# Patient Record
Sex: Male | Born: 1985 | Race: Black or African American | Hispanic: No | Marital: Married | Smoking: Never smoker
Health system: Southern US, Community
[De-identification: ages and names within clinical notes are randomized; demographics above are authoritative.]

---

## 2010-11-10 ENCOUNTER — Emergency Department: Payer: Self-pay | Admitting: Emergency Medicine

## 2014-01-07 ENCOUNTER — Emergency Department: Payer: Self-pay | Admitting: Emergency Medicine

## 2014-01-07 LAB — CBC
HCT: 47 % (ref 40.0–52.0)
HGB: 15.1 g/dL (ref 13.0–18.0)
MCH: 27.9 pg (ref 26.0–34.0)
MCHC: 32 g/dL (ref 32.0–36.0)
MCV: 87 fL (ref 80–100)
Platelet: 358 10*3/uL (ref 150–440)
RBC: 5.39 10*6/uL (ref 4.40–5.90)
RDW: 13.8 % (ref 11.5–14.5)
WBC: 20.5 10*3/uL — AB (ref 3.8–10.6)

## 2014-01-07 LAB — BASIC METABOLIC PANEL
ANION GAP: 9 (ref 7–16)
BUN: 14 mg/dL (ref 7–18)
CHLORIDE: 102 mmol/L (ref 98–107)
CO2: 26 mmol/L (ref 21–32)
Calcium, Total: 9.1 mg/dL (ref 8.5–10.1)
Creatinine: 1.27 mg/dL (ref 0.60–1.30)
EGFR (African American): >60
EGFR (Non-African Amer.): >60
Glucose: 144 mg/dL — ABNORMAL HIGH (ref 65–99)
Osmolality: 277 (ref 275–301)
POTASSIUM: 3.5 mmol/L (ref 3.5–5.1)
Sodium: 137 mmol/L (ref 136–145)

## 2014-01-07 LAB — INFLUENZA A,B,H1N1 - PCR (ARMC)
H1N1FLUPCR: NOT DETECTED
Influenza A By PCR: NEGATIVE
Influenza B By PCR: NEGATIVE

## 2016-08-31 ENCOUNTER — Emergency Department
Admission: EM | Admit: 2016-08-31 | Discharge: 2016-08-31 | Disposition: A | Discharge status: Home / Self Care | Payer: 59 | Attending: Emergency Medicine | Admitting: Emergency Medicine

## 2016-08-31 ENCOUNTER — Emergency Department: Payer: 59

## 2016-08-31 ENCOUNTER — Encounter: Payer: Self-pay | Admitting: Emergency Medicine

## 2016-08-31 DIAGNOSIS — R109 Unspecified abdominal pain: Secondary | ICD-10-CM | POA: Diagnosis not present

## 2016-08-31 DIAGNOSIS — R1011 Right upper quadrant pain: Secondary | ICD-10-CM | POA: Insufficient documentation

## 2016-08-31 LAB — URINALYSIS, COMPLETE (UACMP) WITH MICROSCOPIC
BACTERIA UA: NONE SEEN
BILIRUBIN URINE: NEGATIVE
GLUCOSE, UA: NEGATIVE mg/dL
HGB URINE DIPSTICK: NEGATIVE
Ketones, ur: NEGATIVE mg/dL
LEUKOCYTES UA: NEGATIVE
NITRITE: NEGATIVE
Protein, ur: NEGATIVE mg/dL
SPECIFIC GRAVITY, URINE: 1.027 (ref 1.005–1.030)
Squamous Epithelial / LPF: NONE SEEN
pH: 5 (ref 5.0–8.0)

## 2016-08-31 LAB — CBC
HEMATOCRIT: 42 % (ref 40.0–52.0)
HEMOGLOBIN: 13.9 g/dL (ref 13.0–18.0)
MCH: 28.6 pg (ref 26.0–34.0)
MCHC: 33.2 g/dL (ref 32.0–36.0)
MCV: 86.3 fL (ref 80.0–100.0)
Platelets: 365 10*3/uL (ref 150–440)
RBC: 4.86 MIL/uL (ref 4.40–5.90)
RDW: 13.9 % (ref 11.5–14.5)
WBC: 12.3 10*3/uL — AB (ref 3.8–10.6)

## 2016-08-31 LAB — COMPREHENSIVE METABOLIC PANEL
ALT: 53 U/L (ref 17–63)
ANION GAP: 7 (ref 5–15)
AST: 38 U/L (ref 15–41)
Albumin: 4.8 g/dL (ref 3.5–5.0)
Alkaline Phosphatase: 61 U/L (ref 38–126)
BILIRUBIN TOTAL: 0.5 mg/dL (ref 0.3–1.2)
BUN: 13 mg/dL (ref 6–20)
CO2: 30 mmol/L (ref 22–32)
Calcium: 9.7 mg/dL (ref 8.9–10.3)
Chloride: 103 mmol/L (ref 101–111)
Creatinine, Ser: 0.73 mg/dL (ref 0.61–1.24)
GFR calc Af Amer: >60 mL/min (ref >60)
Glucose, Bld: 95 mg/dL (ref 65–99)
POTASSIUM: 4 mmol/L (ref 3.5–5.1)
Sodium: 140 mmol/L (ref 135–145)
TOTAL PROTEIN: 8.1 g/dL (ref 6.5–8.1)

## 2016-08-31 LAB — LIPASE, BLOOD: Lipase: 43 U/L (ref 11–51)

## 2016-08-31 MED ORDER — IOPAMIDOL (ISOVUE-300) INJECTION 61%
100.0000 mL | Freq: Once | INTRAVENOUS | Status: AC | PRN
  Administered 2016-08-31: 100 mL via INTRAVENOUS
  Filled 2016-08-31: qty 100

## 2016-08-31 MED ORDER — IOPAMIDOL (ISOVUE-300) INJECTION 61%
30.0000 mL | Freq: Once | INTRAVENOUS | Status: AC | PRN
  Administered 2016-08-31: 30 mL via ORAL
  Filled 2016-08-31: qty 30

## 2016-08-31 NOTE — ED Notes (Signed)
E-signature pad not working in the room, unable to obtain E-Signature; discharge instructions reviewed, follow up care reviewed; pt verbalized understanding.

## 2016-08-31 NOTE — ED Triage Notes (Signed)
Pt c/o right side abdominal pain since yesterday. Worse after eating.  No vomiting but has had some loose stools.  No fevers.  Respirations unlabored. ambulatory to triage.  VSS.

## 2016-08-31 NOTE — ED Provider Notes (Signed)
[facility] Emergency Department Provider Note  Time seen: 3:39 PM  I have reviewed the triage vital signs and the nursing notes.   HISTORY  Chief Complaint Abdominal Pain    HPI Ernest Dixon is a 31 y.o. male with no past medical history who presents to the emergency department for right-sided abdominal pain. According to the patient since yesterday he has been having some mild right-sided abdominal pain. Patient states at first he thought he might of pulled a muscle. States he has been tolerating the pain well until he ate lunch today. He states approximately one hour after eating he developed severe right-sided/right upper quadrant pain. Denies any nausea vomiting or diarrhea. Patient states low-grade fever 99.4 at work. Denies any chest pain or trouble breathing. Denies any dysuria or hematuria. No history of kidney stones.  History reviewed. No pertinent past medical history.  There are no active problems to display for this patient.   History reviewed. No pertinent surgical history.  Prior to Admission medications   Not on File    Allergies no known allergies  History reviewed. No pertinent family history.  Social History Social History  Substance Use Topics  . Smoking status: Never Smoker  . Smokeless tobacco: Never Used  . Alcohol use No    Review of Systems Constitutional: Low-grade fever Cardiovascular: Negative for chest pain. Respiratory: Negative for shortness of breath. Gastrointestinal: Right-sided abdominal pain. Negative for nausea vomiting or diarrhea Genitourinary: Negative for dysuria. Negative for hematuria Musculoskeletal: Negative for back pain. Neurological: Negative for headache All other ROS negative  ____________________________________________   PHYSICAL EXAM:  VITAL SIGNS: ED Triage Vitals  Enc Vitals Group     BP 08/31/16 1405 139/83     Pulse Rate 08/31/16 1405 67     Resp 08/31/16 1405 16   Temp 08/31/16 1402 99 F (37.2 C)     Temp Source 08/31/16 1402 Oral     SpO2 08/31/16 1405 100 %     Weight --      Height --      Head Circumference --      Peak Flow --      Pain Score 08/31/16 1402 9     Pain Loc --      Pain Edu? --      Excl. in [place]? --     Constitutional: Alert and oriented. Well appearing and in no distress. Eyes: Normal exam ENT   Head: Normocephalic and atraumatic.   Mouth/Throat: Mucous membranes are moist. Cardiovascular: Normal rate, regular rhythm. No murmur Respiratory: Normal respiratory effort without tachypnea nor retractions. Breath sounds are clear  Gastrointestinal: Soft, moderate right upper quadrant tenderness palpation right mid abdominal tenderness palpation. No right lower quadrant tenderness palpation. No tenderness over McBurney's point. Abdomen otherwise benign. No rebound or guarding. Musculoskeletal: Nontender with normal range of motion in all extremities.  Neurologic:  Normal speech and language. No gross focal neurologic deficits Skin:  Skin is warm, dry and intact.  Psychiatric: Mood and affect are normal  ____________________________________________   RADIOLOGY  Ultrasound negative CT negative  ____________________________________________   INITIAL IMPRESSION / ASSESSMENT AND PLAN / ED COURSE  Pertinent labs & imaging results that were available during my care of the patient were reviewed by me and considered in my medical decision making (see chart for details).  Patient presents to the emergency department for right-sided abdominal pain much worse after eating today. Patient's labs are largely within normal limits with a very  slight leukocytosis. LFTs are normal, lipase is normal, urinalysis is normal. Given the patient's right-sided abdominal pain much worse after eating we'll obtain a right upper quadrant ultrasound to further evaluate. Patient has no right lower quadrant tenderness but he does have right mid  abdominal tenderness as well as right upper quadrant tenderness. If the ultrasound is completely negative we will proceed with a CT scan to rule out appendicitis. If the ultrasound is positive for stones I believe this is likely the cause of the patient's discomfort.  Ultrasound is negative we'll proceed with a CT scan.  CT scan has resulted negative as well. It is unclear the cause of the patient's discomfort at this time could be musculoskeletal versus intestinal. We will discharge with supportive care and follow-up with his doctor. Patient agreeable to plan.  ____________________________________________   FINAL CLINICAL IMPRESSION(S) / ED DIAGNOSES  Right upper quadrant abdominal pain    Minna AntisPaduchowski, Zitlaly Malson, MD 08/31/16 [phone]

## 2016-08-31 NOTE — ED Notes (Signed)

## 2016-09-02 DIAGNOSIS — R748 Abnormal levels of other serum enzymes: Secondary | ICD-10-CM | POA: Diagnosis not present

## 2016-09-02 DIAGNOSIS — R1013 Epigastric pain: Secondary | ICD-10-CM | POA: Diagnosis not present

## 2016-09-02 DIAGNOSIS — R1011 Right upper quadrant pain: Secondary | ICD-10-CM | POA: Diagnosis not present

## 2017-06-14 ENCOUNTER — Encounter: Payer: Self-pay | Admitting: Physician Assistant

## 2017-06-14 ENCOUNTER — Ambulatory Visit (INDEPENDENT_AMBULATORY_CARE_PROVIDER_SITE_OTHER): Payer: 59 | Admitting: Physician Assistant

## 2017-06-14 VITALS — BP 126/78 | HR 60 | Temp 98.8°F | Resp 16 | Ht 69.0 in | Wt 203.0 lb

## 2017-06-14 DIAGNOSIS — Z114 Encounter for screening for human immunodeficiency virus [HIV]: Secondary | ICD-10-CM | POA: Diagnosis not present

## 2017-06-14 DIAGNOSIS — Z13220 Encounter for screening for lipoid disorders: Secondary | ICD-10-CM

## 2017-06-14 DIAGNOSIS — D649 Anemia, unspecified: Secondary | ICD-10-CM

## 2017-06-14 DIAGNOSIS — L989 Disorder of the skin and subcutaneous tissue, unspecified: Secondary | ICD-10-CM

## 2017-06-14 DIAGNOSIS — Z1329 Encounter for screening for other suspected endocrine disorder: Secondary | ICD-10-CM | POA: Diagnosis not present

## 2017-06-14 DIAGNOSIS — Z131 Encounter for screening for diabetes mellitus: Secondary | ICD-10-CM

## 2017-06-14 DIAGNOSIS — Z0000 Encounter for general adult medical examination without abnormal findings: Secondary | ICD-10-CM | POA: Diagnosis not present

## 2017-06-14 NOTE — Patient Instructions (Signed)

## 2017-06-14 NOTE — Progress Notes (Signed)
       Patient: Ernest Dixon Male    DOB: [birth year]   32 y.o.   MRN: [id number] Visit Date: 06/15/2017  Today's Provider: Trey Sailors, PA-C   Chief Complaint  Patient presents with  . Establish Care   Subjective:    HPI  [name] is a 32 y/o man presenting today to establish care. He lives in [place], [place]. Living with wife of 2 years and daughter age 69 mo. Has not had PCP since pediatrician. Works in Environmental manager as Mining engineer. Also involved in [employer]. Smokes marijuana occasionally. Has recently started working out due to long term weight gain.   Reports he has a skin lesion on his left palm that has been there six years. Also reports plantar wart on left foot.       No Known Allergies  No current outpatient medications on file.  Review of Systems  Constitutional: Negative.   HENT: Negative.   Eyes: Negative.   Respiratory: Negative.   Cardiovascular: Negative.   Gastrointestinal: Negative.   Endocrine: Negative.   Genitourinary: Negative.   Musculoskeletal: Negative.   Skin: Negative.   Allergic/Immunologic: Negative.   Neurological: Negative.   Hematological: Negative.   Psychiatric/Behavioral: Negative.     Social History   Tobacco Use  . Smoking status: Never Smoker  . Smokeless tobacco: Never Used  Substance Use Topics  . Alcohol use: No    Comment: Rarely   Objective:   BP 126/78 (BP Location: Left Arm, Patient Position: Sitting, Cuff Size: Large)   Pulse 60   Temp 98.8 F (37.1 C) (Oral)   Resp 16   Ht  (1.753 m)   Wt 203 lb (92.1 kg)   BMI 29.98 kg/m  Vitals:   06/14/17 1511  BP: 126/78  Pulse: 60  Resp: 16  Temp: 98.8 F (37.1 C)  TempSrc: Oral  Weight: 203 lb (92.1 kg)  Height:  (1.753 m)     Physical Exam  Constitutional: He appears well-developed and well-nourished.  Cardiovascular: Normal rate and regular rhythm.  Pulmonary/Chest: Effort normal and breath  sounds normal.  Abdominal: Soft. Bowel sounds are normal.  Skin: Skin is warm and dry.  Psychiatric: He has a normal mood and affect. His behavior is normal.        Assessment & Plan:     1. Annual physical exam   2. Skin lesion  - Ambulatory referral to Dermatology  3. Screening cholesterol level  - Lipid Profile  4. Diabetes mellitus screening  - Comprehensive Metabolic Panel (CMET)  5. Thyroid disorder screening  - TSH  6. Encounter for screening for HIV  - HIV antibody (with reflex)  7. Anemia, unspecified type  - CBC with Differential  Return in about 1 year (around 06/15/2018) for CPE.  The entirety of the information documented in the History of Present Illness, Review of Systems and Physical Exam were personally obtained by me. Portions of this information were initially documented by Kavin Leech, CMA and reviewed by me for thoroughness and accuracy.         Trey Sailors, PA-C  [facility]

## 2018-04-27 IMAGING — CT CT ABD-PELV W/ CM
2 of 4 series · 16 of 46 positions shown, 18 images · IV contrast (APPLIED)
Comparison: Abdominal ultrasound 08/31/2016

CLINICAL DATA: 30-year-old male with a 1 day history of right-sided
abdominal pain

EXAM:
CT ABDOMEN AND PELVIS WITH CONTRAST
TECHNIQUE: Multidetector CT imaging of the abdomen and pelvis was performed
using the standard protocol following bolus administration of
intravenous contrast.
CONTRAST:  100mL [id number] IOPAMIDOL ([id number]) INJECTION 61%

[Series 2: routine abd/pel with · axial · 0.78mm/px · z∈[-1047,-627]mm · 13 of 96 slices shown, 15 images]
[im 8/96  soft-tissue]
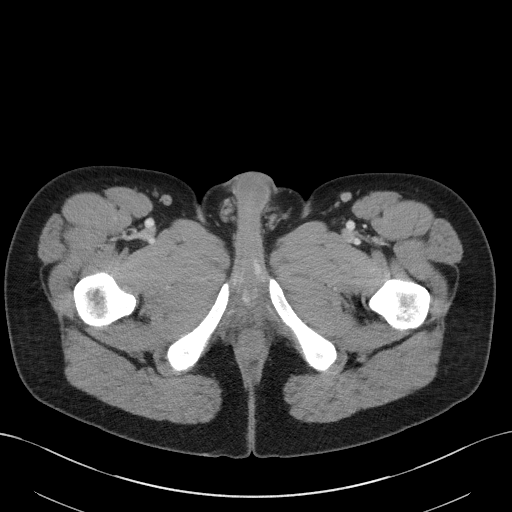
[im 8/96  bone]
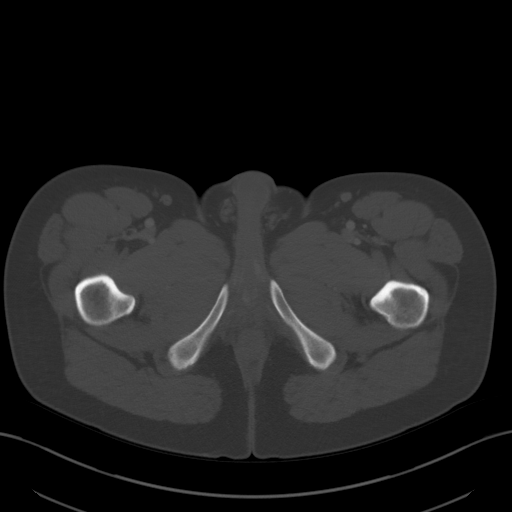
[im 15/96  soft-tissue]
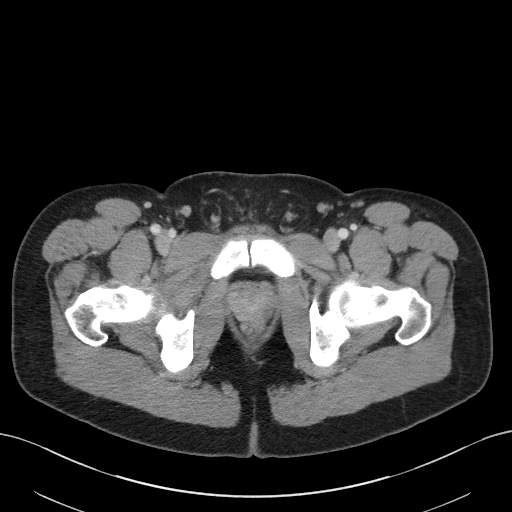
[im 22/96  soft-tissue]
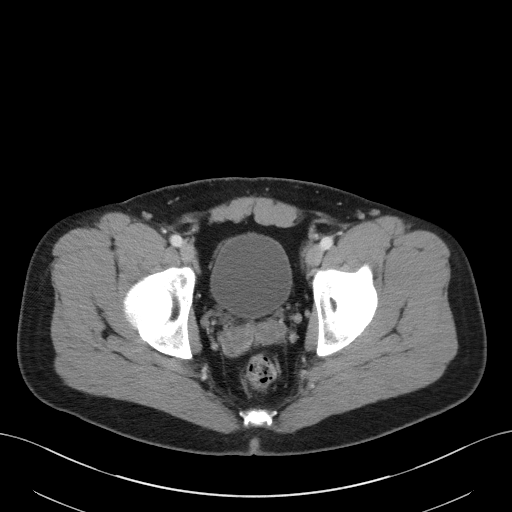
[im 29/96  soft-tissue]
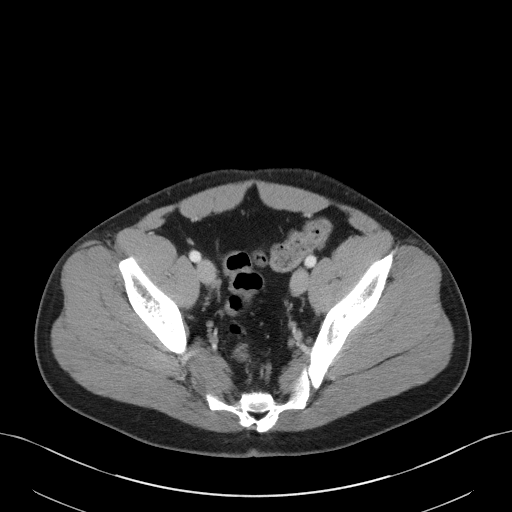
[im 36/96  soft-tissue]
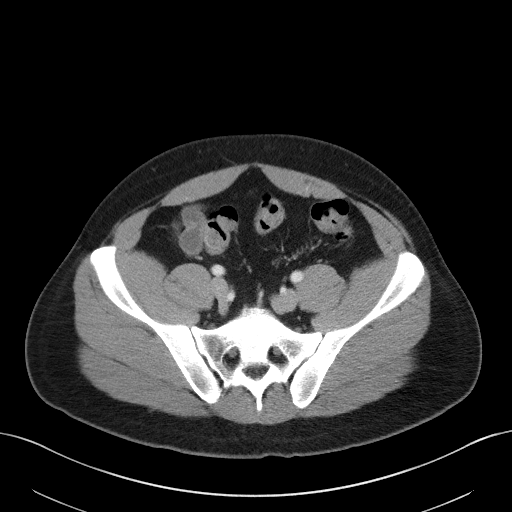
[im 43/96  soft-tissue]
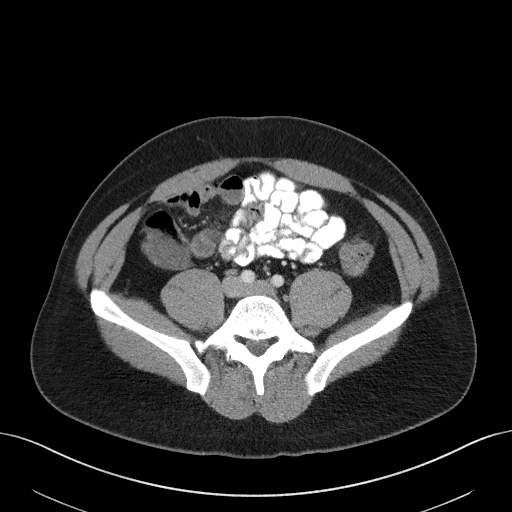
[im 50/96  soft-tissue]
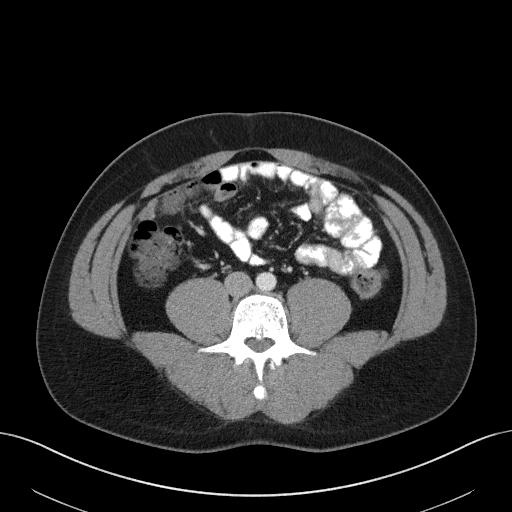
[im 57/96  soft-tissue]
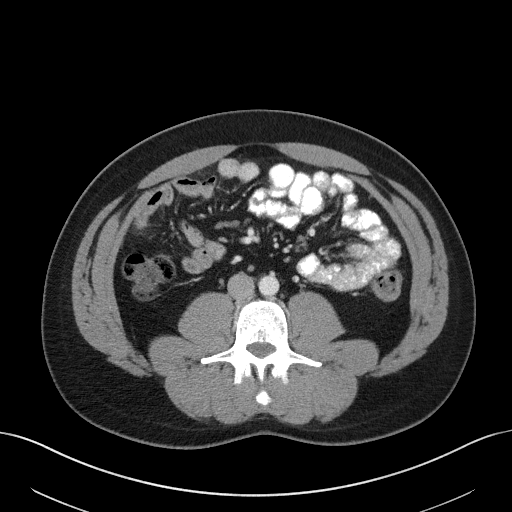
[im 64/96  soft-tissue]
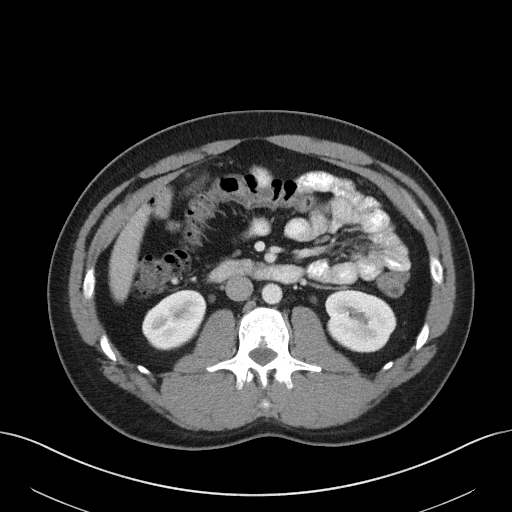
[im 64/96  bone]
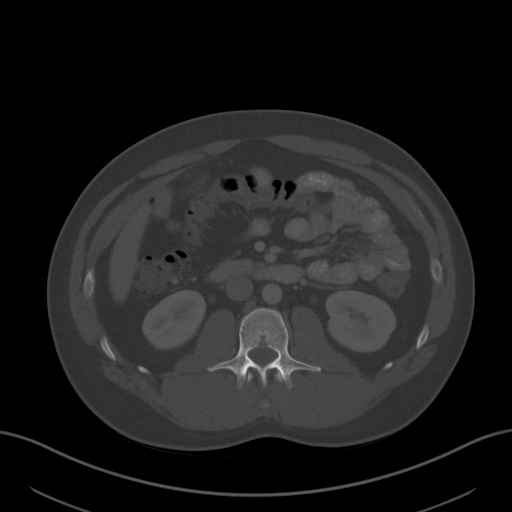
[im 71/96  soft-tissue]
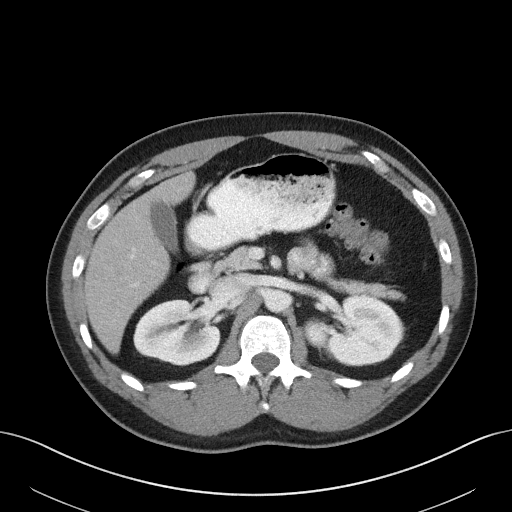
[im 78/96  soft-tissue]
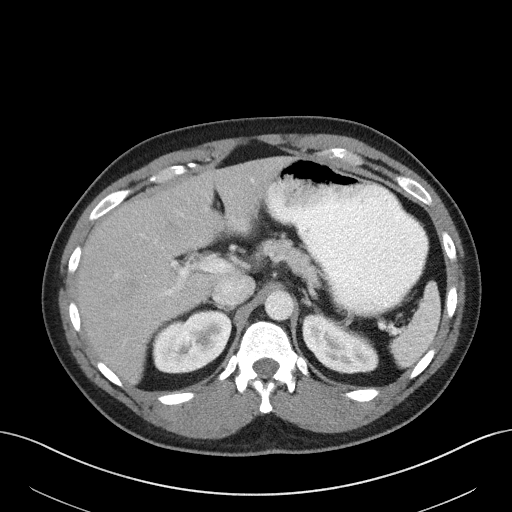
[im 85/96  soft-tissue]
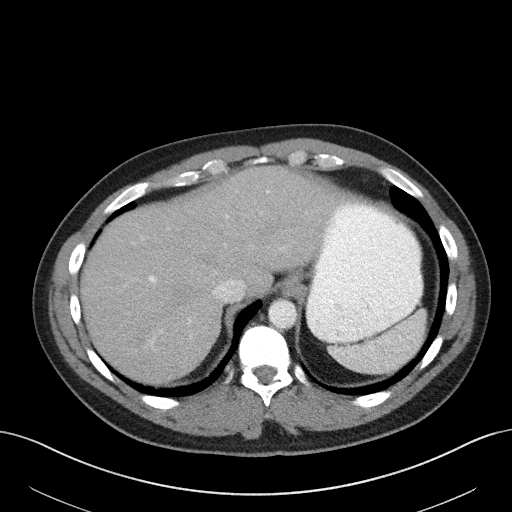
[im 92/96  soft-tissue]
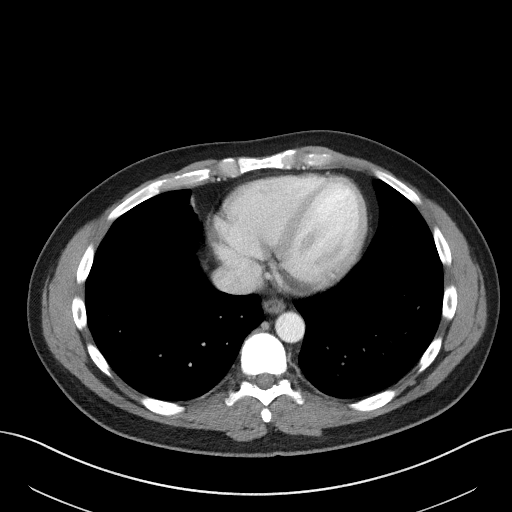

[Series 5: coronal st · coronal · 0.74mm/px · 3 of 85 slices shown]
[im 29/85  soft-tissue]
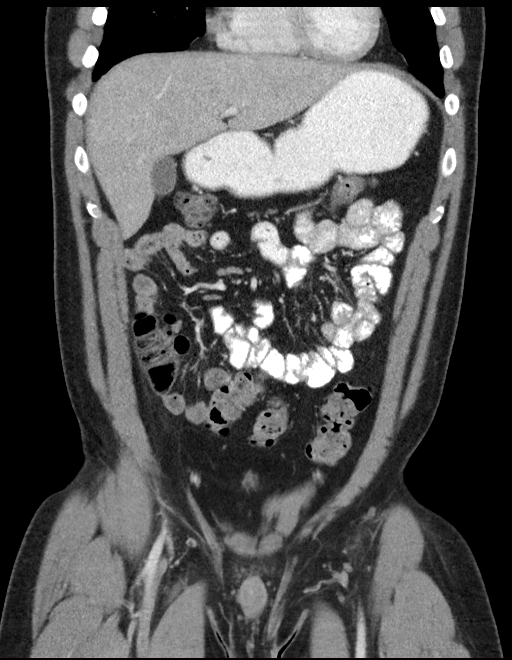
[im 38/85  soft-tissue]
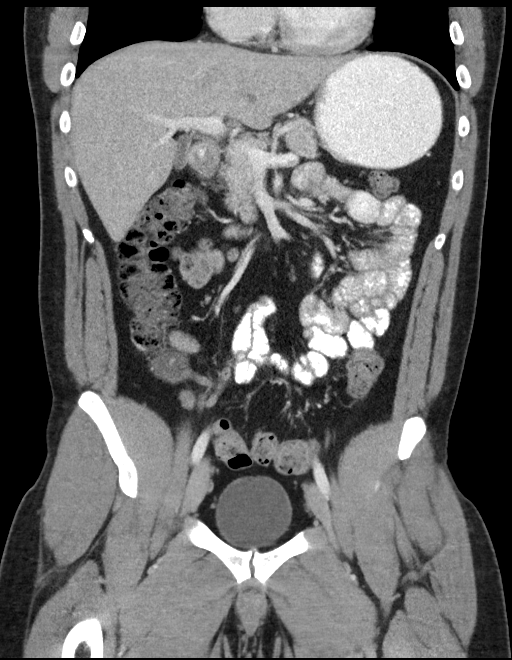
[im 47/85  soft-tissue]
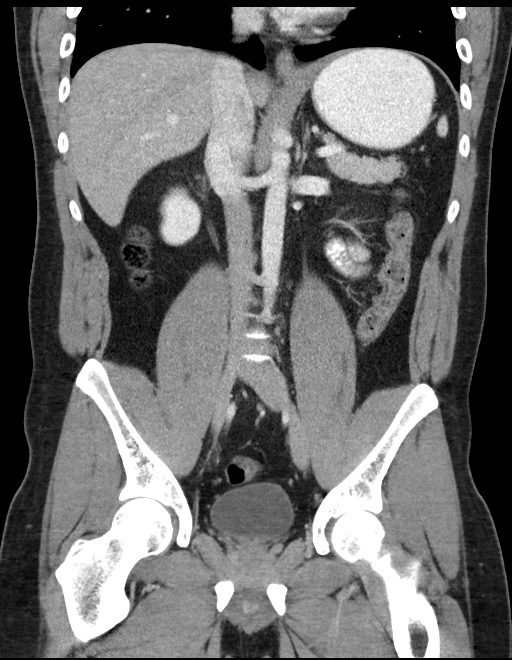

[16 of 46 positions shown; findings below may reference images not displayed]

FINDINGS: Lower chest: The lung bases are clear. Visualized cardiac structures
are within normal limits for size. No pericardial effusion.
Unremarkable visualized distal thoracic esophagus.

Hepatobiliary: Gallbladder is unremarkable. No intra or extrahepatic
biliary ductal dilatation.

Pancreas: Unremarkable. No pancreatic ductal dilatation or
surrounding inflammatory changes.

Spleen: Normal in size without focal abnormality.

Adrenals/Urinary Tract: Adrenal glands are unremarkable. Kidneys are
normal, without renal calculi, focal lesion, or hydronephrosis.
Bladder is unremarkable.

Stomach/Bowel: No evidence of obstruction or focal bowel wall
thickening. Normal appendix in the right lower quadrant. The
terminal ileum is unremarkable.

Vascular/Lymphatic: No significant vascular findings are present. No
enlarged abdominal or pelvic lymph nodes.

Reproductive: Prostate is unremarkable.

Other: Small fat containing umbilical hernia. No abdominopelvic
ascites.

Musculoskeletal: No acute or significant osseous findings.
IMPRESSION: Normal CT scan of the abdomen and pelvis. No acute abnormality to
explain the patient's clinical symptoms.

## 2018-07-26 IMAGING — US US ABDOMEN LIMITED
1 series · 14 of 25 positions shown · non-contrast
Comparison: None.

CLINICAL DATA: Right upper quadrant pain

EXAM:
ULTRASOUND ABDOMEN LIMITED RIGHT UPPER QUADRANT

[Series 1: us abdomen limited · 0.20mm/px · 14 of 40 slices shown]
[im 1/40]
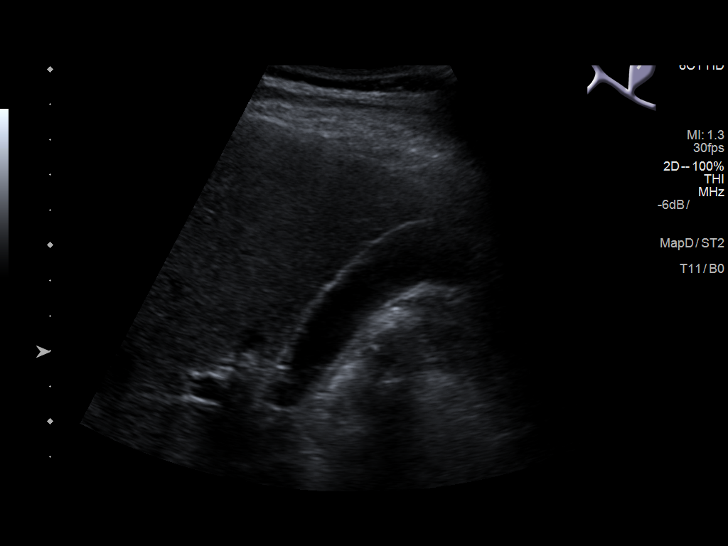
[im 4/40]
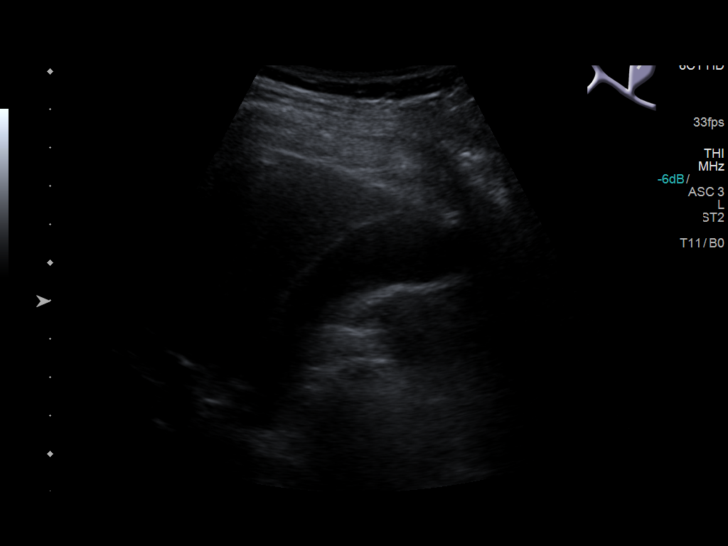
[im 7/40]
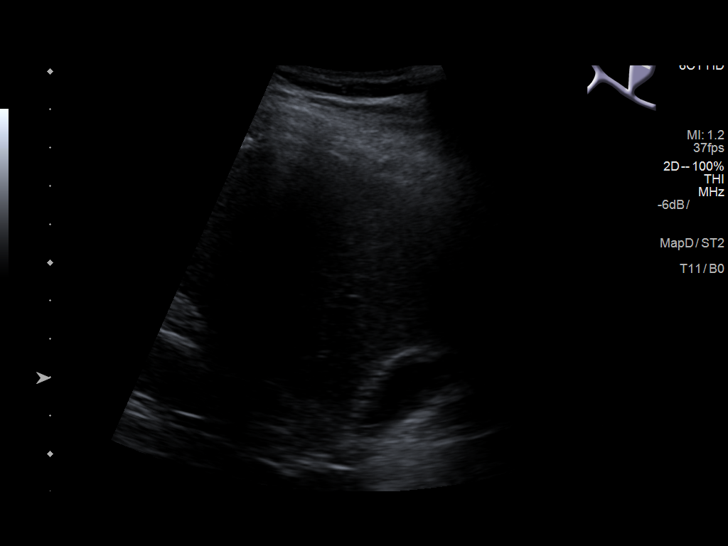
[im 10/40]
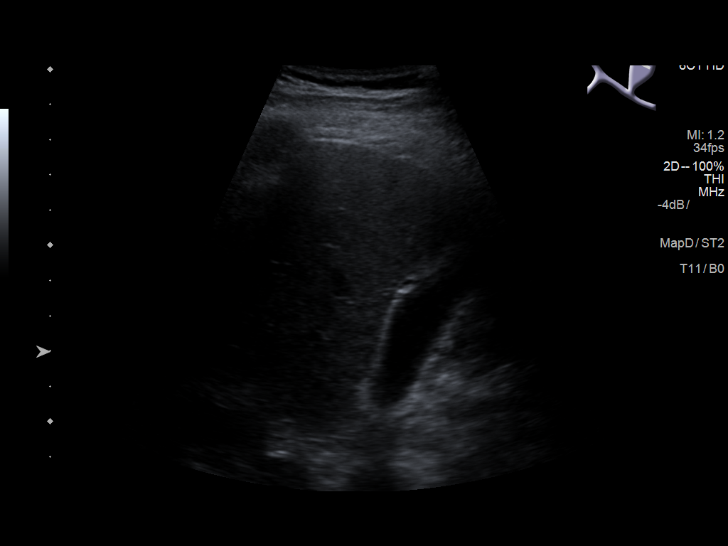
[im 14/40]
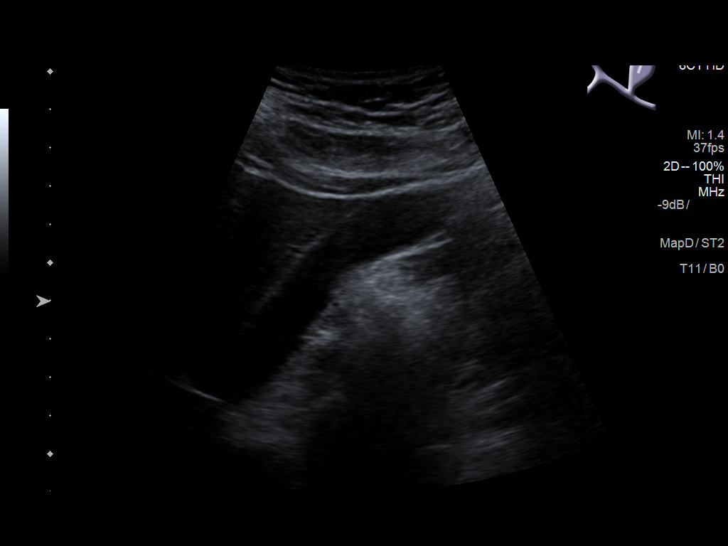
[im 15/40]
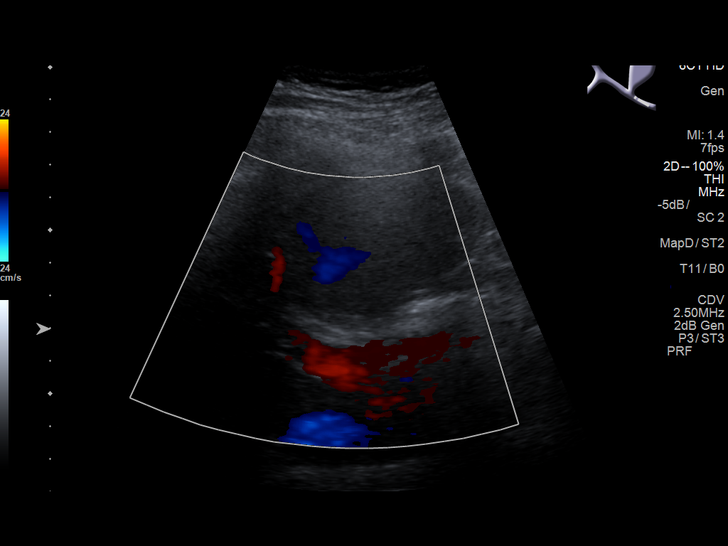
[im 18/40]
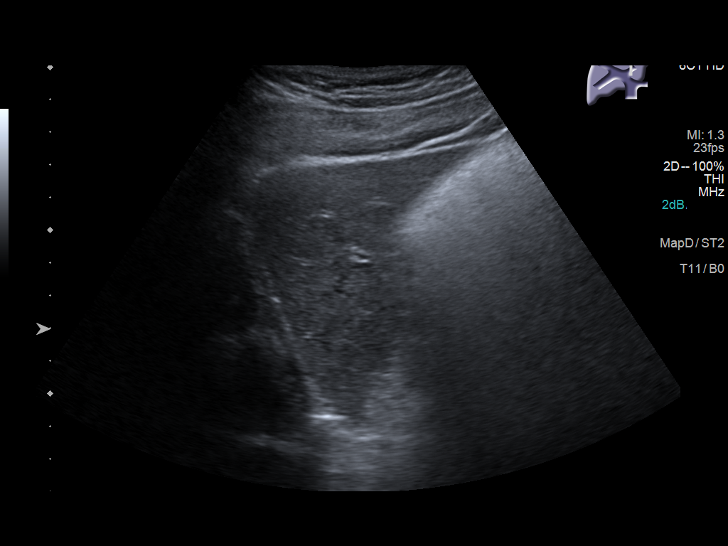
[im 22/40]
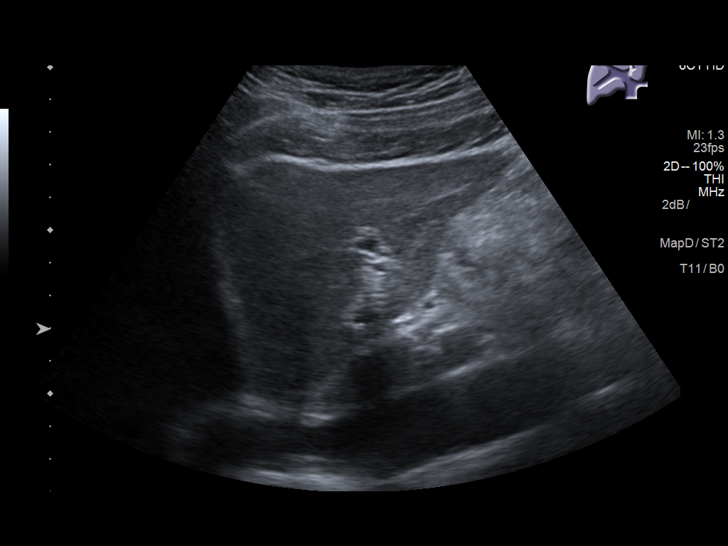
[im 25/40]
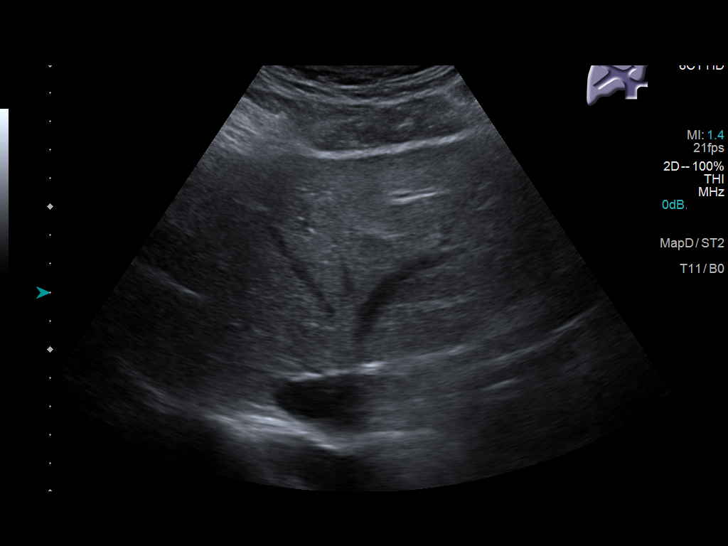
[im 27/40]
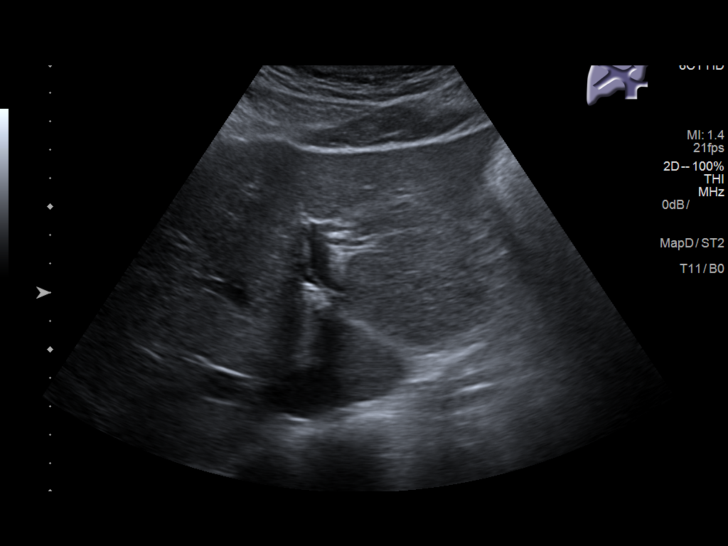
[im 30/40]
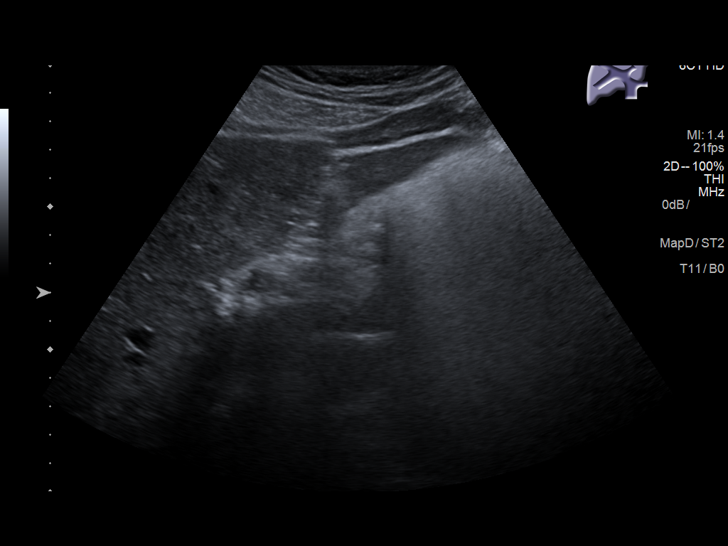
[im 33/40]
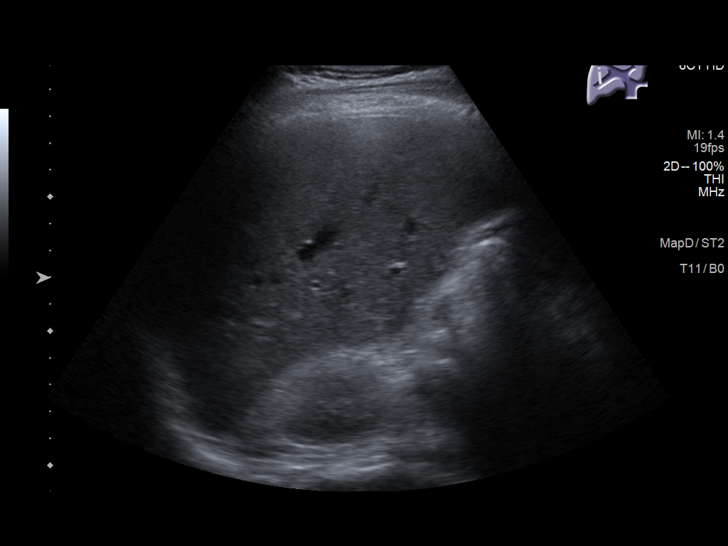
[im 36/40]
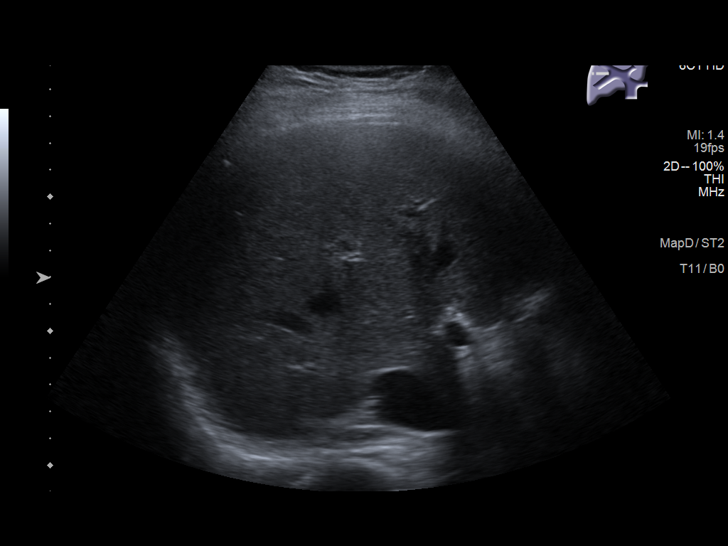
[im 40/40]
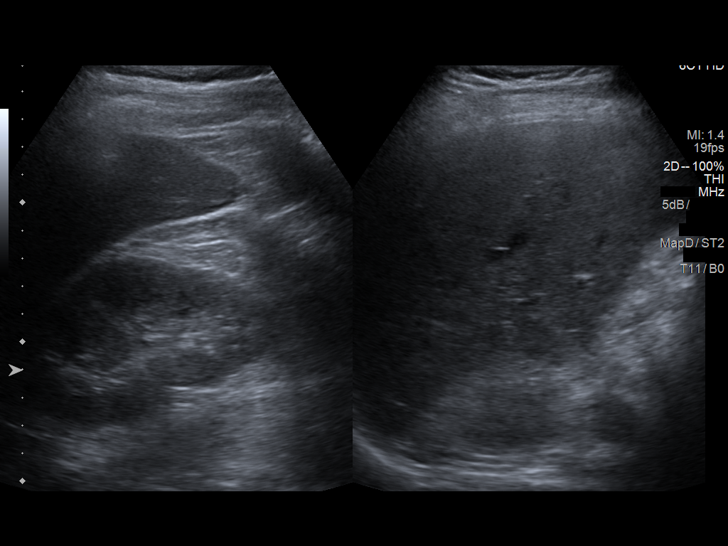

[14 of 25 positions shown; findings below may reference images not displayed]

FINDINGS: Gallbladder:

No gallstones or wall thickening visualized. There is no
pericholecystic fluid. No sonographic Murphy sign noted by
sonographer.

Common bile duct:

Diameter: 2 mm. There is no intrahepatic or extrahepatic biliary
duct dilatation.

Liver:

No focal lesion identified. Within normal limits in parenchymal
echogenicity.
IMPRESSION: Study within normal limits.

## 2018-08-05 ENCOUNTER — Emergency Department
Admission: EM | Admit: 2018-08-05 | Discharge: 2018-08-05 | Disposition: A | Discharge status: Home / Self Care | Payer: 59 | Attending: Emergency Medicine | Admitting: Emergency Medicine

## 2018-08-05 ENCOUNTER — Other Ambulatory Visit: Payer: Self-pay

## 2018-08-05 DIAGNOSIS — T161XXA Foreign body in right ear, initial encounter: Secondary | ICD-10-CM

## 2018-08-05 DIAGNOSIS — Y998 Other external cause status: Secondary | ICD-10-CM | POA: Insufficient documentation

## 2018-08-05 DIAGNOSIS — Y929 Unspecified place or not applicable: Secondary | ICD-10-CM | POA: Insufficient documentation

## 2018-08-05 DIAGNOSIS — X58XXXA Exposure to other specified factors, initial encounter: Secondary | ICD-10-CM | POA: Insufficient documentation

## 2018-08-05 DIAGNOSIS — F1210 Cannabis abuse, uncomplicated: Secondary | ICD-10-CM | POA: Insufficient documentation

## 2018-08-05 DIAGNOSIS — Y9389 Activity, other specified: Secondary | ICD-10-CM | POA: Diagnosis not present

## 2018-08-05 MED ORDER — LIDOCAINE VISCOUS HCL 2 % MT SOLN
15.0000 mL | Freq: Once | OROMUCOSAL | Status: AC
  Administered 2018-08-05: 15 mL via OROMUCOSAL
  Filled 2018-08-05: qty 15

## 2018-08-05 NOTE — ED Notes (Signed)
Pt c/o right ear having possible insect inside. This RN flushed with sterile water and black insect released out with excess water. Insect placed into sterile cup for MD on assessment.

## 2018-08-05 NOTE — ED Triage Notes (Signed)
Feels like there is a bug in right ear.

## 2018-08-05 NOTE — ED Provider Notes (Signed)
[facility] Emergency Department Provider Note  ____________________________________________   First MD Initiated Contact with Patient 08/05/18 [id number]     (approximate)  I have reviewed the triage vital signs and the nursing notes.   HISTORY  Chief Complaint Foreign Body    HPI Ernest Dixon is a 33 y.o. male with no chronic medical issues who reports acute onset of sensation of a foreign body in his right ear while he was asleep.  It is painful and irritating and feels like a bug crawled in.  No loss of hearing.  This is never happened to him before.  Symptoms are severe.  No history of contact with COVID-19 patients and no fever, shortness of breath, nor cough.         No past medical history on file.  There are no active problems to display for this patient.   No past surgical history on file.  Prior to Admission medications   Not on File    Allergies Patient has no known allergies.  Family History  Problem Relation Age of Onset  . Diabetes Mother   . Hypertension Mother   . Hypertension Father   . Cancer Maternal Aunt   . Lupus Maternal Aunt     Social History Social History   Tobacco Use  . Smoking status: Never Smoker  . Smokeless tobacco: Never Used  Substance Use Topics  . Alcohol use: No    Comment: Rarely  . Drug use: Yes    Types: Marijuana    Review of Systems Constitutional: No fever/chills Eyes: No visual changes. ENT: Foreign body sensation in right ear. Respiratory: Denies shortness of breath. Musculoskeletal: Negative for neck pain.  Negative for back pain. Integumentary: Negative for rash. Neurological: Negative for headaches, focal weakness or numbness.   ____________________________________________   PHYSICAL EXAM:  VITAL SIGNS: ED Triage Vitals  Enc Vitals Group     BP 08/05/18 0324 128/69     Pulse Rate 08/05/18 0324 (!) 59     Resp 08/05/18 0324 18     Temp 08/05/18 0324 98.8 F (37.1  C)     Temp Source 08/05/18 0324 Oral     SpO2 08/05/18 0324 97 %     Weight 08/05/18 0251 88.5 kg (195 lb)     Height 08/05/18 0251 1.753 m (5\' 9" )     Head Circumference --      Peak Flow --      Pain Score --      Pain Loc --      Pain Edu? --      Excl. in [place]? --     Constitutional: Alert and oriented. Well appearing and in no acute distress. Eyes: Conjunctivae are normal.  Head: Atraumatic. Ears: I examined the patient after the nurse flushed his ear and removed a small insect from the right side.  At the time I evaluated, his left ear was slightly ceruminous but otherwise normal.  The right ear was clear and there was some erythema of the canal and around the tympanic membrane but the tympanic membrane was intact and there was no visible debris or foreign bodies including insect parts. Nose: No congestion/rhinnorhea. Cardiovascular: Normal rate, regular rhythm. Good peripheral circulation. Respiratory: Normal respiratory effort.  No retractions.  Musculoskeletal: No lower extremity tenderness nor edema. No gross deformities of extremities. Neurologic:  Normal speech and language. No gross focal neurologic deficits are appreciated.  Skin:  Skin is warm, dry and intact. No rash  noted.   ____________________________________________   LABS (all labs ordered are listed, but only abnormal results are displayed)  Labs Reviewed - No data to display ____________________________________________  EKG  None - EKG not ordered by ED physician ____________________________________________  RADIOLOGY   ED MD interpretation: No indication for imaging  Official radiology report(s): No results found.  ____________________________________________   PROCEDURES   Procedure(s) performed (including Critical Care):  Procedures   ____________________________________________   INITIAL IMPRESSION / MDM / ASSESSMENT AND PLAN / ED COURSE  As part of my medical decision making, I  reviewed the following data within the electronic MEDICAL RECORD NUMBER Nursing notes reviewed and incorporated, Old chart reviewed and Notes from prior ED visits        The patient was seen by Erie NoeVanessa the ED nurse prior to my assessment and she was able to successfully flush out the right ear and remove a small insect.  The patient reports that he is still numb from the lidocaine but he no longer has any foreign body sensation.  On my assessment the ear canal is irritated but clear and the tympanic membrane is intact.  I recommended the patient use over-the-counter ibuprofen and Tylenol and follow-up with his regular doctor as needed.  I gave my usual customary return precautions.      ____________________________________________  FINAL CLINICAL IMPRESSION(S) / ED DIAGNOSES  Final diagnoses:  Foreign body in ear, right, initial encounter     MEDICATIONS GIVEN DURING THIS VISIT:  Medications  lidocaine (XYLOCAINE) 2 % viscous mouth solution 15 mL (15 mLs Mouth/Throat Given 08/05/18 0325)     ED Discharge Orders    None      *Please note:  [name] was evaluated in Emergency Department on 08/05/2018 for the symptoms described in the history of present illness. He was evaluated in the context of the global COVID-19 pandemic, which necessitated consideration that the patient might be at risk for infection with the SARS-CoV-2 virus that causes COVID-19. Institutional protocols and algorithms that pertain to the evaluation of patients at risk for COVID-19 are in a state of rapid change based on information released by regulatory bodies including the CDC and federal and state organizations. These policies and algorithms were followed during the patient's care in the ED.  Some ED evaluations and interventions may be delayed as a result of limited staffing during the pandemic.*  Note:  This document was prepared using Dragon voice recognition software and may include unintentional dictation  errors.   Loleta RoseForbach, Evani Shrider, MD 08/05/18 [phone]

## 2019-04-11 ENCOUNTER — Ambulatory Visit: Payer: Self-pay | Attending: Internal Medicine

## 2019-04-11 DIAGNOSIS — Z23 Encounter for immunization: Secondary | ICD-10-CM

## 2019-04-11 NOTE — Progress Notes (Signed)
   Covid-19 Vaccination Clinic  Name:  Ernest Dixon    MRN: [id number] DOB: 1985-02-11  04/11/2019  Ernest Dixon was observed post Covid-19 immunization for 15 minutes without incident. He was provided with Vaccine Information Sheet and instruction to access the V-Safe system.   Ernest Dixon was instructed to call 911 with any severe reactions post vaccine: Marland Kitchen Difficulty breathing  . Swelling of face and throat  . A fast heartbeat  . A bad rash all over body  . Dizziness and weakness   Immunizations Administered    Name Date Dose VIS Date Route   Moderna COVID-19 Vaccine 04/11/2019 10:06 AM 0.5 mL 12/31/2018 Intramuscular   Manufacturer: Moderna   Lot: [id number]   NDC: 38329-191-66

## 2019-05-13 ENCOUNTER — Ambulatory Visit: Payer: Self-pay | Attending: Internal Medicine

## 2019-05-13 DIAGNOSIS — Z23 Encounter for immunization: Secondary | ICD-10-CM

## 2019-05-13 NOTE — Progress Notes (Signed)
   Covid-19 Vaccination Clinic  Name:  Rodriquez Thorner    MRN: [id number] DOB: [birth year]  05/13/2019  Mr. Ohms was observed post Covid-19 immunization for 15 minutes without incident. He was provided with Vaccine Information Sheet and instruction to access the V-Safe system.   Mr. Loconte was instructed to call 911 with any severe reactions post vaccine: Marland Kitchen Difficulty breathing  . Swelling of face and throat  . A fast heartbeat  . A bad rash all over body  . Dizziness and weakness   Immunizations Administered    Name Date Dose VIS Date Route   Moderna COVID-19 Vaccine 05/13/2019  9:23 AM 0.5 mL 12/31/2018 Intramuscular   Manufacturer: Moderna   Lot: [id number]   NDC: 69450-388-82

## 2019-06-16 NOTE — Progress Notes (Signed)
I,Laura E Walsh,acting as a Neurosurgeon for [employer], PA-C.,have documented all relevant documentation on the behalf of Trey Sailors, PA-C,as directed by  Trey Sailors, PA-C while in the presence of Trey Sailors, PA-C.    Complete physical exam   Patient: Ernest Dixon   DOB: [birth year]   33 y.o. Male  MRN: [id number] Visit Date: 06/17/2019  Today's healthcare provider: Trey Sailors, PA-C   Chief Complaint  Patient presents with  . Annual Exam   Subjective     HPI  Ernest Dixon is a 34 y.o. male who presents today for a complete physical exam.  He reports consuming a general diet. The patient does not participate in regular exercise at present. He generally feels well. He reports sleeping fairly well. He does not have additional problems to discuss today.    No past medical history on file. History reviewed. No pertinent surgical history. Social History   Socioeconomic History  . Marital status: Married    Spouse name: Not on file  . Number of children: Not on file  . Years of education: Not on file  . Highest education level: Not on file  Occupational History  . Not on file  Tobacco Use  . Smoking status: Never Smoker  . Smokeless tobacco: Never Used  Substance and Sexual Activity  . Alcohol use: No    Comment: Rarely  . Drug use: Yes    Types: Marijuana  . Sexual activity: Not on file  Other Topics Concern  . Not on file  Social History Narrative  . Not on file   Social Determinants of Health   Financial Resource Strain:   . Difficulty of Paying Living Expenses:   Food Insecurity:   . Worried About Programme researcher, broadcasting/film/video in the Last Year:   . Barista in the Last Year:   Transportation Needs:   . Freight forwarder (Medical):   Marland Kitchen Lack of Transportation (Non-Medical):   Physical Activity:   . Days of Exercise per Week:   . Minutes of Exercise per Session:   Stress:   . Feeling of Stress :   Social Connections:    . Frequency of Communication with Friends and Family:   . Frequency of Social Gatherings with Friends and Family:   . Attends Religious Services:   . Active Member of Clubs or Organizations:   . Attends Banker Meetings:   Marland Kitchen Marital Status:   Intimate Partner Violence:   . Fear of Current or Ex-Partner:   . Emotionally Abused:   Marland Kitchen Physically Abused:   . Sexually Abused:    Family Status  Relation Name Status  . Mother  Alive  . Father  Alive  . Mat [employer]  . Mat Aunt  Alive   Family History  Problem Relation Age of Onset  . Diabetes Mother   . Hypertension Mother   . Hypertension Father   . Cancer Maternal Aunt   . Lupus Maternal Aunt    No Known Allergies  Patient Care Team: Maryella Shivers as PCP - General (Physician Assistant)   Medications: No outpatient medications prior to visit.   No facility-administered medications prior to visit.    Review of Systems  Constitutional: Negative.   HENT: Negative.   Eyes: Negative.   Respiratory: Negative.   Cardiovascular: Negative.   Gastrointestinal: Negative.   Endocrine: Negative.   Genitourinary: Negative.   Musculoskeletal: Negative.  Skin: Negative.   Allergic/Immunologic: Negative.   Neurological: Negative.   Hematological: Negative.   Psychiatric/Behavioral: Negative.     Last CBC Lab Results  Component Value Date   WBC 12.3 (H) 08/31/2016   HGB 13.9 08/31/2016   HCT 42.0 08/31/2016   MCV 86.3 08/31/2016   MCH 28.6 08/31/2016   RDW 13.9 08/31/2016   PLT 365 34/19/6222   Last metabolic panel Lab Results  Component Value Date   GLUCOSE 95 08/31/2016   NA 140 08/31/2016   K 4.0 08/31/2016   CL 103 08/31/2016   CO2 30 08/31/2016   BUN 13 08/31/2016   CREATININE 0.73 08/31/2016   GFRNONAA >60 08/31/2016   GFRAA >60 08/31/2016   CALCIUM 9.7 08/31/2016   PROT 8.1 08/31/2016   ALBUMIN 4.8 08/31/2016   BILITOT 0.5 08/31/2016   ALKPHOS 61 08/31/2016   AST 38  08/31/2016   ALT 53 08/31/2016   ANIONGAP 7 08/31/2016   Last lipids No results found for: CHOL, HDL, LDLCALC, LDLDIRECT, TRIG, CHOLHDL Last hemoglobin A1c No results found for: HGBA1C Last thyroid functions No results found for: TSH, T3TOTAL, T4TOTAL, THYROIDAB Last vitamin D No results found for: 25OHVITD2, 25OHVITD3, VD25OH Last vitamin B12 and Folate No results found for: VITAMINB12, FOLATE  Objective    BP 122/84 (BP Location: Left Arm, Patient Position: Sitting, Cuff Size: Large)   Pulse 70   Temp (!) 97.5 F (36.4 C) (Temporal)   Ht 5\' 10"  (1.778 m)   Wt 203 lb (92.1 kg)   SpO2 99%   BMI 29.13 kg/m    Physical Exam Vitals and nursing note reviewed.  Constitutional:      General: He is not in acute distress.    Appearance: Normal appearance. He is well-developed. He is not diaphoretic.  HENT:     Head: Normocephalic and atraumatic.     Right Ear: Tympanic membrane, ear canal and external ear normal.     Left Ear: Tympanic membrane, ear canal and external ear normal.     Nose: Nose normal.     Mouth/Throat:     Mouth: Mucous membranes are moist.     Pharynx: Oropharynx is clear. No oropharyngeal exudate.  Eyes:     General: No scleral icterus.    Conjunctiva/sclera: Conjunctivae normal.     Pupils: Pupils are equal, round, and reactive to light.  Neck:     Thyroid: No thyromegaly.  Cardiovascular:     Rate and Rhythm: Normal rate and regular rhythm.     Heart sounds: Normal heart sounds. No murmur.  Pulmonary:     Effort: Pulmonary effort is normal. No respiratory distress.     Breath sounds: Normal breath sounds. No wheezing or rales.  Abdominal:     General: Bowel sounds are normal. There is no distension.     Palpations: Abdomen is soft.     Tenderness: There is no abdominal tenderness. There is no guarding or rebound.  Musculoskeletal:        General: No deformity.     Cervical back: Neck supple.     Right lower leg: No edema.     Left lower leg:  No edema.  Lymphadenopathy:     Cervical: No cervical adenopathy.  Skin:    General: Skin is warm and dry.     Findings: No rash.  Neurological:     Mental Status: He is alert and oriented to person, place, and time. Mental status is at baseline.  Psychiatric:  Mood and Affect: Mood normal.        Behavior: Behavior normal.        Thought Content: Thought content normal.        Judgment: Judgment normal.      Depression Screen  PHQ 2/9 Scores 06/17/2019 06/20/2017  PHQ - 2 Score 0 0  PHQ- 9 Score 6 0    No results found for any visits on 06/17/19.  Assessment & Plan    Routine Health Maintenance and Physical Exam  Exercise Activities and Dietary recommendations Goals   None     Immunization History  Administered Date(s) Administered  . Moderna SARS-COVID-2 Vaccination 04/11/2019, 05/13/2019    Health Maintenance  Topic Date Due  . HIV Screening  Never done  . INFLUENZA VACCINE  08/31/2019  . TETANUS/TDAP  09/11/2021  . COVID-19 Vaccine  Completed    Discussed health benefits of physical activity, and encouraged him to engage in regular exercise appropriate for his age and condition. Problem List Items Addressed This Visit      Other   Encounter for screening for HIV - Primary   Relevant Orders   HIV antibody (with reflex)   Overweight (BMI 25.0-29.9)    Discussed the importance of maintaining a healthy weight.  Discussed cutting back sugary drinks such as sodas.  Work on diet and exercise.            Return in about 1 year (around 06/16/2020) for CPE .     ITrey Sailors, PA-C, have reviewed all documentation for this visit. The documentation on 06/17/19 for the exam, diagnosis, procedures, and orders are all accurate and complete.   Maryella Shivers  [facility] [phone] (phone) [phone] (fax)  [facility]

## 2019-06-17 ENCOUNTER — Other Ambulatory Visit: Payer: Self-pay

## 2019-06-17 ENCOUNTER — Ambulatory Visit (INDEPENDENT_AMBULATORY_CARE_PROVIDER_SITE_OTHER): Payer: BC Managed Care – PPO | Admitting: Physician Assistant

## 2019-06-17 ENCOUNTER — Encounter: Payer: Self-pay | Admitting: Physician Assistant

## 2019-06-17 VITALS — BP 122/84 | HR 70 | Temp 97.5°F | Ht 70.0 in | Wt 203.0 lb

## 2019-06-17 DIAGNOSIS — Z114 Encounter for screening for human immunodeficiency virus [HIV]: Secondary | ICD-10-CM | POA: Diagnosis not present

## 2019-06-17 DIAGNOSIS — Z0000 Encounter for general adult medical examination without abnormal findings: Secondary | ICD-10-CM | POA: Diagnosis not present

## 2019-06-17 DIAGNOSIS — E663 Overweight: Secondary | ICD-10-CM

## 2019-06-17 NOTE — Assessment & Plan Note (Signed)
Discussed the importance of maintaining a healthy weight.  Discussed cutting back sugary drinks such as sodas.  Work on diet and exercise.

## 2019-06-17 NOTE — Patient Instructions (Signed)
Preventive Care 19-33 Years Old, Male Preventive care refers to lifestyle choices and visits with your health care provider that can promote health and wellness. This includes:  A yearly physical exam. This is also called an annual well check.  Regular dental and eye exams.  Immunizations.  Screening for certain conditions.  Healthy lifestyle choices, such as eating a healthy diet, getting regular exercise, not using drugs or products that contain nicotine and tobacco, and limiting alcohol use. What can I expect for my preventive care visit? Physical exam Your health care provider will check:  Height and weight. These may be used to calculate body mass index (BMI), which is a measurement that tells if you are at a healthy weight.  Heart rate and blood pressure.  Your skin for abnormal spots. Counseling Your health care provider may ask you questions about:  Alcohol, tobacco, and drug use.  Emotional well-being.  Home and relationship well-being.  Sexual activity.  Eating habits.  Work and work Statistician. What immunizations do I need?  Influenza (flu) vaccine  This is recommended every year. Tetanus, diphtheria, and pertussis (Tdap) vaccine  You may need a Td booster every 10 years. Varicella (chickenpox) vaccine  You may need this vaccine if you have not already been vaccinated. Human papillomavirus (HPV) vaccine  If recommended by your health care provider, you may need three doses over 6 months. Measles, mumps, and rubella (MMR) vaccine  You may need at least one dose of MMR. You may also need a second dose. Meningococcal conjugate (MenACWY) vaccine  One dose is recommended if you are 45-76 years old and a Market researcher living in a residence hall, or if you have one of several medical conditions. You may also need additional booster doses. Pneumococcal conjugate (PCV13) vaccine  You may need this if you have certain conditions and were not  previously vaccinated. Pneumococcal polysaccharide (PPSV23) vaccine  You may need one or two doses if you smoke cigarettes or if you have certain conditions. Hepatitis A vaccine  You may need this if you have certain conditions or if you travel or work in places where you may be exposed to hepatitis A. Hepatitis B vaccine  You may need this if you have certain conditions or if you travel or work in places where you may be exposed to hepatitis B. Haemophilus influenzae type b (Hib) vaccine  You may need this if you have certain risk factors. You may receive vaccines as individual doses or as more than one vaccine together in one shot (combination vaccines). Talk with your health care provider about the risks and benefits of combination vaccines. What tests do I need? Blood tests  Lipid and cholesterol levels. These may be checked every 5 years starting at age 17.  Hepatitis C test.  Hepatitis B test. Screening   Diabetes screening. This is done by checking your blood sugar (glucose) after you have not eaten for a while (fasting).  Sexually transmitted disease (STD) testing. Talk with your health care provider about your test results, treatment options, and if necessary, the need for more tests. Follow these instructions at home: Eating and drinking   Eat a diet that includes fresh fruits and vegetables, whole grains, lean protein, and low-fat dairy products.  Take vitamin and mineral supplements as recommended by your health care provider.  Do not drink alcohol if your health care provider tells you not to drink.  If you drink alcohol: ? Limit how much you have to 0-2  drinks a day. ? Be aware of how much alcohol is in your drink. In the [place] one drink equals one 12 oz bottle of beer (355 mL), one 5 oz glass of wine (148 mL), or one 1 oz glass of hard liquor (44 mL). Lifestyle  Take daily care of your teeth and gums.  Stay active. Exercise for at least 30 minutes on 5 or  more days each week.  Do not use any products that contain nicotine or tobacco, such as cigarettes, e-cigarettes, and chewing tobacco. If you need help quitting, ask your health care provider.  If you are sexually active, practice safe sex. Use a condom or other form of protection to prevent STIs (sexually transmitted infections). What's next?  Go to your health care provider once a year for a well check visit.  Ask your health care provider how often you should have your eyes and teeth checked.  Stay up to date on all vaccines. This information is not intended to replace advice given to you by your health care provider. Make sure you discuss any questions you have with your health care provider. Document Revised: 01/10/2018 Document Reviewed: 01/10/2018 Elsevier Patient Education  2020 [employer].

## 2019-06-18 ENCOUNTER — Telehealth: Payer: Self-pay

## 2019-06-18 LAB — CBC WITH DIFFERENTIAL/PLATELET
Basophils Absolute: 0 10*3/uL (ref 0.0–0.2)
Basos: 0 %
EOS (ABSOLUTE): 0.3 10*3/uL (ref 0.0–0.4)
Eos: 3 %
Hematocrit: 44.2 % (ref 37.5–51.0)
Hemoglobin: 14.5 g/dL (ref 13.0–17.7)
Immature Grans (Abs): 0.1 10*3/uL (ref 0.0–0.1)
Immature Granulocytes: 1 %
Lymphocytes Absolute: 3.3 10*3/uL — ABNORMAL HIGH (ref 0.7–3.1)
Lymphs: 30 %
MCH: 29.1 pg (ref 26.6–33.0)
MCHC: 32.8 g/dL (ref 31.5–35.7)
MCV: 89 fL (ref 79–97)
Monocytes Absolute: 0.7 10*3/uL (ref 0.1–0.9)
Monocytes: 7 %
Neutrophils Absolute: 6.6 10*3/uL (ref 1.4–7.0)
Neutrophils: 59 %
Platelets: 421 10*3/uL (ref 150–450)
RBC: 4.98 x10E6/uL (ref 4.14–5.80)
RDW: 13.2 % (ref 11.6–15.4)
WBC: 11 10*3/uL — ABNORMAL HIGH (ref 3.4–10.8)

## 2019-06-18 LAB — COMPREHENSIVE METABOLIC PANEL
ALT: 27 IU/L (ref 0–44)
AST: 20 IU/L (ref 0–40)
Albumin/Globulin Ratio: 1.6 (ref 1.2–2.2)
Albumin: 4.6 g/dL (ref 4.0–5.0)
Alkaline Phosphatase: 73 IU/L (ref 48–121)
BUN/Creatinine Ratio: 15 (ref 9–20)
BUN: 14 mg/dL (ref 6–20)
Bilirubin Total: 0.3 mg/dL (ref 0.0–1.2)
CO2: 23 mmol/L (ref 20–29)
Calcium: 9.6 mg/dL (ref 8.7–10.2)
Chloride: 102 mmol/L (ref 96–106)
Creatinine, Ser: 0.95 mg/dL (ref 0.76–1.27)
GFR calc Af Amer: 122 mL/min/{1.73_m2} (ref >59)
GFR calc non Af Amer: 105 mL/min/{1.73_m2} (ref >59)
Globulin, Total: 2.9 g/dL (ref 1.5–4.5)
Glucose: 70 mg/dL (ref 65–99)
Potassium: 4.2 mmol/L (ref 3.5–5.2)
Sodium: 142 mmol/L (ref 134–144)
Total Protein: 7.5 g/dL (ref 6.0–8.5)

## 2019-06-18 LAB — LIPID PANEL
Chol/HDL Ratio: 3.7 ratio (ref 0.0–5.0)
Cholesterol, Total: 151 mg/dL (ref 100–199)
HDL: 41 mg/dL (ref >39)
LDL Chol Calc (NIH): 78 mg/dL (ref 0–99)
Triglycerides: 190 mg/dL — ABNORMAL HIGH (ref 0–149)
VLDL Cholesterol Cal: 32 mg/dL (ref 5–40)

## 2019-06-18 LAB — HIV ANTIBODY (ROUTINE TESTING W REFLEX): HIV Screen 4th Generation wRfx: NONREACTIVE

## 2019-06-18 LAB — TSH: TSH: 1.61 u[IU]/mL (ref 0.450–4.500)

## 2019-06-18 NOTE — Telephone Encounter (Signed)
Pt. Called back. Given results and recommendations. States he will speak with his wife about the referral and call back.

## 2019-06-18 NOTE — Telephone Encounter (Signed)
Tried calling patient and no answer, left voicemail for patient to return call. If patient calls back okay for PEC to advise patient of message.

## 2019-06-18 NOTE — Telephone Encounter (Signed)
-----   Message from Trey Sailors, [place] sent at 06/18/2019  9:43 AM EDT ----- His white blood cell count is slightly elevated, though it's lower than it has been historically. This can happen for a variety of reasons including infections, genetic traits, and rarely cancer. It is just barely elevated on last check but if he would like to see a hematologist for further eval of this I am happy to make the referral. The rest of the labwork is normal.

## 2019-06-18 NOTE — Telephone Encounter (Signed)
Patient scheduled appointment with you this afternoon.FYI

## 2020-04-23 ENCOUNTER — Telehealth: Payer: Self-pay

## 2020-04-23 NOTE — Telephone Encounter (Signed)
Called patient to rescheduled and patient stated this has to be talked with wife. Canceled patient appointment wit Ricki Rodriguez 05/19 and told patient to let his wife know and that she can call to rescheduled his appointment once she checks her schedule.

## 2020-06-17 ENCOUNTER — Encounter: Payer: BC Managed Care – PPO | Admitting: Physician Assistant

## 2020-07-30 ENCOUNTER — Other Ambulatory Visit: Payer: Self-pay

## 2020-07-30 ENCOUNTER — Ambulatory Visit: Payer: Self-pay | Admitting: Physician Assistant

## 2020-07-30 ENCOUNTER — Encounter: Payer: Self-pay | Admitting: Physician Assistant

## 2020-07-30 DIAGNOSIS — Z113 Encounter for screening for infections with a predominantly sexual mode of transmission: Secondary | ICD-10-CM

## 2020-07-30 NOTE — Progress Notes (Signed)
[facility] Department STI clinic/screening visit  Subjective:  Ernest Dixon is a 35 y.o. male being seen today for an STI screening visit. The patient reports they do have symptoms.    Patient has the following medical conditions:   Patient Active Problem List   Diagnosis Date Noted   Encounter for screening for HIV 06/17/2019   Overweight (BMI 25.0-29.9) 06/17/2019     Chief Complaint  Patient presents with   SEXUALLY TRANSMITTED DISEASE    screening    HPI  Patient reports that he has had "bumps" on his penis for about 3 weeks.  Patient denies that they have been painful, itching, or having any drainage.  Reports that they are now healing.  Denies chronic conditions, surgeries and regular medicines.  Reports last HIV test was in 2020 and last void prior to sample collection for Gram stain was less than 2 hr ago.    See flowsheet for further details and programmatic requirements.    The following portions of the patient's history were reviewed and updated as appropriate: allergies, current medications, past medical history, past social history, past surgical history and problem list.  Objective:  There were no vitals filed for this visit.  Physical Exam Constitutional:      General: He is not in acute distress.    Appearance: Normal appearance.  HENT:     Head: Normocephalic and atraumatic.     Comments: No nits,lice, or hair loss. No cervical, supraclavicular or axillary adenopathy.     Mouth/Throat:     Mouth: Mucous membranes are moist.     Pharynx: Oropharynx is clear. No oropharyngeal exudate or posterior oropharyngeal erythema.  Eyes:     Conjunctiva/sclera: Conjunctivae normal.  Pulmonary:     Effort: Pulmonary effort is normal.  Abdominal:     Palpations: Abdomen is soft. There is no mass.     Tenderness: There is no abdominal tenderness. There is no guarding or rebound.  Genitourinary:    Penis: Normal.      Testes: Normal.      Comments: Pubic area without nits, lice, hair loss, edema, erythema, lesions and inguinal adenopathy. Penis circumcised without rash and discharge at meatus. Shaft of the penis with 3 ~2 mm pinkish, healed areas, nt, non-ulcerative. Testicles descended bilaterally,nt, no masses or edema.  Musculoskeletal:     Cervical back: Neck supple. No tenderness.  Skin:    General: Skin is warm and dry.     Findings: No bruising, erythema, lesion or rash.  Neurological:     Mental Status: He is alert and oriented to person, place, and time.  Psychiatric:        Mood and Affect: Mood normal.        Behavior: Behavior normal.        Thought Content: Thought content normal.        Judgment: Judgment normal.      Assessment and Plan:  Ernest Dixon is a 35 y.o. male presenting to the [facility] Department for STI screening  1. Screening for STD (sexually transmitted disease) Patient into clinic with symptoms. Patient declines screening for GC and NGU today. Reassured patient that likely "bumps" were related to a contact dermatitis or folliculitis since they are healed. Enc patient to RTC ASAP if has lesions appear again for more accurate evaluation. Rec condoms with all sex. Await test results.  Counseled that RN will call if needs to RTC for treatment once results are back.  -  [facility] [place] [facility] - Syphilis Serology, [facility]     No follow-ups on file.  No future appointments.  Matt Holmes, PA

## 2020-08-05 LAB — HM HIV SCREENING LAB: HM HIV Screening: NEGATIVE

## 2020-10-06 ENCOUNTER — Ambulatory Visit: Payer: Self-pay | Admitting: Surgery

## 2020-10-06 NOTE — H&P (Signed)
Subjective:   CC: Umbilical hernia with obstruction, without gangrene [K42.0]  HPI:  Ernest Dixon is a 35 y.o. male who was referred by Drinda Butts, * for evaluation of above. Symptoms were first noted several months ago. Asymptomatic. Associated with nothing specific, exacerbated by nothing specific.  Lump is not reducible.    Past Medical History:  has no past medical history on file.  Past Surgical History:  Past Surgical History:  Procedure Laterality Date   FINGER SURGERY        Family History: family history is not on file.  Social History:  reports that he has never smoked. He has never used smokeless tobacco. He reports current drug use. He reports that he does not drink alcohol.  Current Medications: currently has no medications in their medication list.  Allergies:  Allergies as of 10/06/2020   (No Known Allergies)    ROS:  A 15 point review of systems was performed and pertinent positives and negatives noted in HPI   Objective:     BP 126/89   Pulse 74   Ht 175.3 cm (5\' 9" )   Wt 89.4 kg (197 lb)   BMI 29.09 kg/m   Constitutional :  alert, appears stated age, cooperative and no distress  Lymphatics/Throat:  no asymmetry, masses, or scars  Respiratory:  clear to auscultation bilaterally  Cardiovascular:  regular rate and rhythm  Gastrointestinal: soft, non-tender; bowel sounds normal; no masses,  no organomegaly. umbilical hernia noted.  moderate, no overlying skin changes and TTP only with attempt at reduction  Musculoskeletal: Steady gait and movement  Skin: Cool and moist  Psychiatric: Normal affect, non-agitated, not confused       LABS:  n/a   RADS: n/a Assessment:       Umbilical hernia with obstruction, without gangrene [K42.0]  Plan:     1. Umbilical hernia with obstruction, without gangrene [K42.0]   Discussed the risk of surgery including recurrence, which can be up to 50% in the case of incisional or complex hernias,  possible use of prosthetic materials (mesh) and the increased risk of mesh infxn if used, bleeding, chronic pain, post-op infxn, post-op SBO or ileus, and possible re-operation to address said risks. The risks of general anesthetic, if used, includes MI, CVA, sudden death or even reaction to anesthetic medications also discussed. Alternatives include continued observation.  Benefits include possible symptom relief, prevention of incarceration, strangulation, enlargement in size over time, and the risk of emergency surgery in the face of strangulation.   Typical post-op recovery time of 3-5 days with 3-4 weeks of activity restrictions were also discussed.    Pt had additional question regarding mesh use and indications, concerns of family members with issues with them.  We discussed specific type of mesh used for this type of repair and good safety record and no official restrictions of its use at this time.  ED return precautions given for sudden increase in pain, size of hernia with accompanying fever, nausea, and/or vomiting.  The patient verbalized understanding and all questions were answered to the patient's satisfaction.  2. Patient has elected to proceed with surgical treatment. Procedure will be scheduled.  Written consent was obtained. open due to size and self pay.

## 2020-10-21 ENCOUNTER — Ambulatory Visit: Admission: RE | Admit: 2020-10-21 | Payer: Self-pay | Source: Home / Self Care | Admitting: Surgery

## 2020-10-21 ENCOUNTER — Encounter: Admission: RE | Payer: Self-pay | Source: Home / Self Care

## 2020-10-21 SURGERY — REPAIR, HERNIA, UMBILICAL, ADULT
Anesthesia: General | Site: Abdomen

## 2021-02-01 ENCOUNTER — Other Ambulatory Visit: Payer: Self-pay

## 2021-02-01 ENCOUNTER — Other Ambulatory Visit
Admission: RE | Admit: 2021-02-01 | Discharge: 2021-02-01 | Disposition: A | Discharge status: Home / Self Care | Payer: Self-pay | Source: Ambulatory Visit | Attending: Surgery | Admitting: Surgery

## 2021-02-01 DIAGNOSIS — Z01812 Encounter for preprocedural laboratory examination: Secondary | ICD-10-CM

## 2021-02-01 NOTE — Patient Instructions (Addendum)
Your procedure is scheduled on: 02/10/21 - Thursday Report to the Registration Desk on the 1st floor of the [facility]. To find out your arrival time, please call [phone] between 1PM - 3PM on: 02/09/21 - Wednesday  REMEMBER: Instructions that are not followed completely may result in serious medical risk, up to and including death; or upon the discretion of your surgeon and anesthesiologist your surgery may need to be rescheduled.  Do not eat food after midnight the night before surgery.  No gum chewing, lozengers or hard candies.  You may however, drink CLEAR liquids up to 2 hours before you are scheduled to arrive for your surgery. Do not drink anything within 2 hours of your scheduled arrival time.  Clear liquids include: - water  - apple juice without pulp - gatorade (not RED, PURPLE, OR BLUE) - black coffee or tea (Do NOT add milk or creamers to the coffee or tea) Do NOT drink anything that is not on this list.   TAKE THESE MEDICATIONS THE MORNING OF SURGERY WITH A SIP OF WATER: NONE  One week prior to surgery: Stop Anti-inflammatories (NSAIDS) such as Advil, Aleve, Ibuprofen, Motrin, Naproxen, Naprosyn and Aspirin based products such as Excedrin, Goodys Powder, BC Powder.  Stop ANY OVER THE COUNTER supplements until after surgery.  You may however, continue to take Tylenol if needed for pain up until the day of surgery.  No Alcohol for 24 hours before or after surgery.  No Smoking including e-cigarettes for 24 hours prior to surgery.  No chewable tobacco products for at least 6 hours prior to surgery.  No nicotine patches on the day of surgery.  Do not use any "recreational" drugs for at least a week prior to your surgery.  Please be advised that the combination of cocaine and anesthesia may have negative outcomes, up to and including death. If you test positive for cocaine, your surgery will be cancelled.  On the morning of surgery brush your teeth with  toothpaste and water, you may rinse your mouth with mouthwash if you wish. Do not swallow any toothpaste or mouthwash.  Use CHG Soap or wipes as directed on instruction sheet.  Do not wear jewelry, make-up, hairpins, clips or nail polish.  Do not wear lotions, powders, or perfumes.   Do not shave body from the neck down 48 hours prior to surgery just in case you cut yourself which could leave a site for infection.  Also, freshly shaved skin may become irritated if using the CHG soap.  Contact lenses, hearing aids and dentures may not be worn into surgery.  Do not bring valuables to the hospital. [facility] is not responsible for any missing/lost belongings or valuables.   Notify your doctor if there is any change in your medical condition (cold, fever, infection).  Wear comfortable clothing (specific to your surgery type) to the hospital.  After surgery, you can help prevent lung complications by doing breathing exercises.  Take deep breaths and cough every 1-2 hours. Your doctor may order a device called an Incentive Spirometer to help you take deep breaths. When coughing or sneezing, hold a pillow firmly against your incision with both hands. This is called splinting. Doing this helps protect your incision. It also decreases belly discomfort.  If you are being admitted to the hospital overnight, leave your suitcase in the car. After surgery it may be brought to your room.  If you are being discharged the day of surgery, you will not be  allowed to drive home. You will need a responsible adult (18 years or older) to drive you home and stay with you that night.   If you are taking public transportation, you will need to have a responsible adult (18 years or older) with you. Please confirm with your physician that it is acceptable to use public transportation.   Please call the [place] [facility] at [phone] if you have any questions about these  instructions.  Surgery Visitation Policy:  Patients undergoing a surgery or procedure may have one family member or support person with them as long as that person is not COVID-19 positive or experiencing its symptoms.  That person may remain in the waiting area during the procedure and may rotate out with other people.  Inpatient Visitation:    Visiting hours are 7 a.m. to 8 p.m. Up to two visitors ages 16+ are allowed at one time in a patient room. The visitors may rotate out with other people during the day. Visitors must check out when they leave, or other visitors will not be allowed. One designated support person may remain overnight. The visitor must pass COVID-19 screenings, use hand sanitizer when entering and exiting the patients room and wear a mask at all times, including in the patients room. Patients must also wear a mask when staff or their visitor are in the room. Masking is required regardless of vaccination status.

## 2021-02-10 ENCOUNTER — Ambulatory Visit: Payer: BC Managed Care – PPO

## 2021-02-10 ENCOUNTER — Other Ambulatory Visit: Payer: Self-pay

## 2021-02-10 ENCOUNTER — Encounter
Admission: RE | Disposition: A | Discharge status: Home / Self Care | Payer: Self-pay | Source: Home / Self Care | Attending: Surgery

## 2021-02-10 ENCOUNTER — Encounter: Payer: Self-pay | Admitting: Surgery

## 2021-02-10 ENCOUNTER — Ambulatory Visit
Admission: RE | Admit: 2021-02-10 | Discharge: 2021-02-10 | Disposition: A | Discharge status: Home / Self Care | Payer: BC Managed Care – PPO | Attending: Surgery | Admitting: Surgery

## 2021-02-10 DIAGNOSIS — Z6828 Body mass index (BMI) 28.0-28.9, adult: Secondary | ICD-10-CM | POA: Insufficient documentation

## 2021-02-10 DIAGNOSIS — E669 Obesity, unspecified: Secondary | ICD-10-CM | POA: Insufficient documentation

## 2021-02-10 DIAGNOSIS — K420 Umbilical hernia with obstruction, without gangrene: Secondary | ICD-10-CM

## 2021-02-10 DIAGNOSIS — Z01812 Encounter for preprocedural laboratory examination: Secondary | ICD-10-CM

## 2021-02-10 HISTORY — PX: UMBILICAL HERNIA REPAIR: SHX196

## 2021-02-10 LAB — URINE DRUG SCREEN, QUALITATIVE (ARMC ONLY)
Amphetamines, Ur Screen: NOT DETECTED
Barbiturates, Ur Screen: NOT DETECTED
Benzodiazepine, Ur Scrn: NOT DETECTED
Cannabinoid 50 Ng, Ur ~~LOC~~: NOT DETECTED
Cocaine Metabolite,Ur ~~LOC~~: NOT DETECTED
MDMA (Ecstasy)Ur Screen: NOT DETECTED
Methadone Scn, Ur: NOT DETECTED
Opiate, Ur Screen: NOT DETECTED
Phencyclidine (PCP) Ur S: NOT DETECTED
Tricyclic, Ur Screen: NOT DETECTED

## 2021-02-10 SURGERY — REPAIR, HERNIA, UMBILICAL, ADULT
Anesthesia: General | Site: Abdomen

## 2021-02-10 MED ORDER — CHLORHEXIDINE GLUCONATE 0.12 % MT SOLN: 15.0000 mL | Freq: Once | OROMUCOSAL | Status: AC

## 2021-02-10 MED ORDER — DEXAMETHASONE SODIUM PHOSPHATE 10 MG/ML IJ SOLN
INTRAMUSCULAR | Status: DC | PRN
Start: 2021-02-10 — End: 2021-02-10
  Administered 2021-02-10: 5 mg via INTRAVENOUS

## 2021-02-10 MED ORDER — OXYCODONE HCL 5 MG/5ML PO SOLN: 5.0000 mg | Freq: Once | ORAL | Status: AC | PRN

## 2021-02-10 MED ORDER — FENTANYL CITRATE (PF) 100 MCG/2ML IJ SOLN
INTRAMUSCULAR | Status: DC | PRN
  Administered 2021-02-10: 50 ug via INTRAVENOUS
  Administered 2021-02-10: 100 ug via INTRAVENOUS
  Administered 2021-02-10: 50 ug via INTRAVENOUS

## 2021-02-10 MED ORDER — 0.9 % SODIUM CHLORIDE (POUR BTL) OPTIME
TOPICAL | Status: DC | PRN
  Administered 2021-02-10: 5 mL

## 2021-02-10 MED ORDER — MIDAZOLAM HCL 2 MG/2ML IJ SOLN
INTRAMUSCULAR | Status: AC
  Filled 2021-02-10: qty 2

## 2021-02-10 MED ORDER — LIDOCAINE HCL (PF) 2 % IJ SOLN
INTRAMUSCULAR | Status: AC
  Filled 2021-02-10: qty 5

## 2021-02-10 MED ORDER — GABAPENTIN 300 MG PO CAPS: 300.0000 mg | ORAL_CAPSULE | ORAL | Status: AC

## 2021-02-10 MED ORDER — ORAL CARE MOUTH RINSE: 15.0000 mL | Freq: Once | OROMUCOSAL | Status: AC

## 2021-02-10 MED ORDER — ACETAMINOPHEN 500 MG PO TABS
ORAL_TABLET | ORAL | Status: AC
  Administered 2021-02-10: 1000 mg via ORAL
  Filled 2021-02-10: qty 2

## 2021-02-10 MED ORDER — CELECOXIB 200 MG PO CAPS: 200.0000 mg | ORAL_CAPSULE | ORAL | Status: AC

## 2021-02-10 MED ORDER — OXYCODONE HCL 5 MG PO TABS: 5.0000 mg | ORAL_TABLET | Freq: Once | ORAL | Status: AC | PRN

## 2021-02-10 MED ORDER — OXYCODONE HCL 5 MG PO TABS
ORAL_TABLET | ORAL | Status: AC
  Administered 2021-02-10: 5 mg via ORAL
  Filled 2021-02-10: qty 1

## 2021-02-10 MED ORDER — LACTATED RINGERS IV SOLN
INTRAVENOUS | Status: DC
  Administered 2021-02-10: 200 mL via INTRAVENOUS

## 2021-02-10 MED ORDER — ACETAMINOPHEN 500 MG PO TABS: 1000.0000 mg | ORAL_TABLET | ORAL | Status: AC

## 2021-02-10 MED ORDER — DEXMEDETOMIDINE (PRECEDEX) IN NS 20 MCG/5ML (4 MCG/ML) IV SYRINGE
PREFILLED_SYRINGE | INTRAVENOUS | Status: AC
  Filled 2021-02-10: qty 5

## 2021-02-10 MED ORDER — BUPIVACAINE-MELOXICAM ER 200-6 MG/7ML IJ SOLN
INTRAMUSCULAR | Status: DC | PRN
  Administered 2021-02-10: 7 mL

## 2021-02-10 MED ORDER — CELECOXIB 200 MG PO CAPS
ORAL_CAPSULE | ORAL | Status: AC
  Administered 2021-02-10: 200 mg via ORAL
  Filled 2021-02-10: qty 1

## 2021-02-10 MED ORDER — CHLORHEXIDINE GLUCONATE CLOTH 2 % EX PADS: 6.0000 | MEDICATED_PAD | Freq: Once | CUTANEOUS | Status: DC

## 2021-02-10 MED ORDER — FENTANYL CITRATE (PF) 100 MCG/2ML IJ SOLN
INTRAMUSCULAR | Status: AC
  Filled 2021-02-10: qty 2

## 2021-02-10 MED ORDER — PROPOFOL 10 MG/ML IV BOLUS
INTRAVENOUS | Status: AC
  Filled 2021-02-10: qty 40

## 2021-02-10 MED ORDER — ROCURONIUM BROMIDE 100 MG/10ML IV SOLN
INTRAVENOUS | Status: DC | PRN
  Administered 2021-02-10: 10 mg via INTRAVENOUS
  Administered 2021-02-10: 50 mg via INTRAVENOUS

## 2021-02-10 MED ORDER — BUPIVACAINE-MELOXICAM ER 200-6 MG/7ML IJ SOLN
INTRAMUSCULAR | Status: AC
  Filled 2021-02-10: qty 1

## 2021-02-10 MED ORDER — HYDROCODONE-ACETAMINOPHEN 5-325 MG PO TABS: 1.0000 | ORAL_TABLET | Freq: Four times a day (QID) | ORAL | 0 refills | Status: DC | PRN

## 2021-02-10 MED ORDER — CEFAZOLIN SODIUM-DEXTROSE 2-4 GM/100ML-% IV SOLN
INTRAVENOUS | Status: AC
  Filled 2021-02-10: qty 100

## 2021-02-10 MED ORDER — SUCCINYLCHOLINE CHLORIDE 200 MG/10ML IV SOSY
PREFILLED_SYRINGE | INTRAVENOUS | Status: DC | PRN
  Administered 2021-02-10: 120 mg via INTRAVENOUS

## 2021-02-10 MED ORDER — DEXMEDETOMIDINE (PRECEDEX) IN NS 20 MCG/5ML (4 MCG/ML) IV SYRINGE
PREFILLED_SYRINGE | INTRAVENOUS | Status: DC | PRN
  Administered 2021-02-10: 8 ug via INTRAVENOUS
  Administered 2021-02-10: 4 ug via INTRAVENOUS

## 2021-02-10 MED ORDER — FENTANYL CITRATE (PF) 100 MCG/2ML IJ SOLN
25.0000 ug | INTRAMUSCULAR | Status: DC | PRN
  Administered 2021-02-10: 50 ug via INTRAVENOUS

## 2021-02-10 MED ORDER — BUPIVACAINE-EPINEPHRINE (PF) 0.5% -1:200000 IJ SOLN
INTRAMUSCULAR | Status: DC | PRN
  Administered 2021-02-10: 5 mL

## 2021-02-10 MED ORDER — SUGAMMADEX SODIUM 200 MG/2ML IV SOLN
INTRAVENOUS | Status: DC | PRN
Start: 2021-02-10 — End: 2021-02-10
  Administered 2021-02-10 (×2): 50 mg via INTRAVENOUS
  Administered 2021-02-10: 100 mg via INTRAVENOUS

## 2021-02-10 MED ORDER — BUPIVACAINE LIPOSOME 1.3 % IJ SUSP
INTRAMUSCULAR | Status: AC
  Filled 2021-02-10: qty 20

## 2021-02-10 MED ORDER — PROPOFOL 10 MG/ML IV BOLUS
INTRAVENOUS | Status: DC | PRN
  Administered 2021-02-10: 200 mg via INTRAVENOUS

## 2021-02-10 MED ORDER — FAMOTIDINE 20 MG PO TABS: 20.0000 mg | ORAL_TABLET | Freq: Once | ORAL | Status: AC

## 2021-02-10 MED ORDER — IBUPROFEN 800 MG PO TABS: 800.0000 mg | ORAL_TABLET | Freq: Three times a day (TID) | ORAL | 0 refills | Status: DC | PRN

## 2021-02-10 MED ORDER — BUPIVACAINE-EPINEPHRINE (PF) 0.5% -1:200000 IJ SOLN
INTRAMUSCULAR | Status: AC
  Filled 2021-02-10: qty 30

## 2021-02-10 MED ORDER — ONDANSETRON HCL 4 MG/2ML IJ SOLN
INTRAMUSCULAR | Status: DC | PRN
Start: 2021-02-10 — End: 2021-02-10
  Administered 2021-02-10: 4 mg via INTRAVENOUS

## 2021-02-10 MED ORDER — FAMOTIDINE 20 MG PO TABS
ORAL_TABLET | ORAL | Status: AC
  Administered 2021-02-10: 20 mg via ORAL
  Filled 2021-02-10: qty 1

## 2021-02-10 MED ORDER — SUCCINYLCHOLINE CHLORIDE 200 MG/10ML IV SOSY
PREFILLED_SYRINGE | INTRAVENOUS | Status: AC
  Filled 2021-02-10: qty 10

## 2021-02-10 MED ORDER — LIDOCAINE HCL (CARDIAC) PF 100 MG/5ML IV SOSY
PREFILLED_SYRINGE | INTRAVENOUS | Status: DC | PRN
  Administered 2021-02-10: 100 mg via INTRAVENOUS

## 2021-02-10 MED ORDER — CHLORHEXIDINE GLUCONATE 0.12 % MT SOLN
OROMUCOSAL | Status: AC
  Administered 2021-02-10: 15 mL via OROMUCOSAL
  Filled 2021-02-10: qty 15

## 2021-02-10 MED ORDER — ONDANSETRON HCL 4 MG/2ML IJ SOLN
INTRAMUSCULAR | Status: AC
  Filled 2021-02-10: qty 2

## 2021-02-10 MED ORDER — MIDAZOLAM HCL 2 MG/2ML IJ SOLN
INTRAMUSCULAR | Status: DC | PRN
  Administered 2021-02-10: 2 mg via INTRAVENOUS

## 2021-02-10 MED ORDER — DOCUSATE SODIUM 100 MG PO CAPS: 100.0000 mg | ORAL_CAPSULE | Freq: Two times a day (BID) | ORAL | 0 refills | Status: AC | PRN

## 2021-02-10 MED ORDER — GABAPENTIN 300 MG PO CAPS
ORAL_CAPSULE | ORAL | Status: AC
  Administered 2021-02-10: 300 mg via ORAL
  Filled 2021-02-10: qty 1

## 2021-02-10 MED ORDER — FENTANYL CITRATE (PF) 100 MCG/2ML IJ SOLN
INTRAMUSCULAR | Status: AC
  Administered 2021-02-10: 50 ug via INTRAVENOUS
  Filled 2021-02-10: qty 2

## 2021-02-10 MED ORDER — DEXAMETHASONE SODIUM PHOSPHATE 10 MG/ML IJ SOLN
INTRAMUSCULAR | Status: AC
  Filled 2021-02-10: qty 1

## 2021-02-10 MED ORDER — ACETAMINOPHEN 325 MG PO TABS: 650.0000 mg | ORAL_TABLET | Freq: Three times a day (TID) | ORAL | 0 refills | Status: AC | PRN

## 2021-02-10 MED ORDER — CEFAZOLIN SODIUM-DEXTROSE 2-4 GM/100ML-% IV SOLN
2.0000 g | INTRAVENOUS | Status: AC
  Administered 2021-02-10: 2 g via INTRAVENOUS

## 2021-02-10 SURGICAL SUPPLY — 32 items
BLADE SURG 15 STRL LF DISP TIS (BLADE) ×1 IMPLANT
BLADE SURG 15 STRL SS (BLADE) ×1
CHLORAPREP W/TINT 26 (MISCELLANEOUS) ×2 IMPLANT
DERMABOND ADVANCED (GAUZE/BANDAGES/DRESSINGS) ×1
DERMABOND ADVANCED .7 DNX12 (GAUZE/BANDAGES/DRESSINGS) ×1 IMPLANT
DRAPE LAPAROTOMY 77X122 PED (DRAPES) ×2 IMPLANT
ELECT REM PT RETURN 9FT ADLT (ELECTROSURGICAL) ×2
ELECTRODE REM PT RTRN 9FT ADLT (ELECTROSURGICAL) ×1 IMPLANT
GAUZE 4X4 16PLY ~~LOC~~+RFID DBL (SPONGE) ×2 IMPLANT
GLOVE SURG SYN 6.5 ES PF (GLOVE) ×2 IMPLANT
GLOVE SURG SYN 6.5 PF PI (GLOVE) ×1 IMPLANT
GLOVE SURG UNDER POLY LF SZ7 (GLOVE) ×2 IMPLANT
GOWN STRL REUS W/ TWL LRG LVL3 (GOWN DISPOSABLE) ×3 IMPLANT
GOWN STRL REUS W/TWL LRG LVL3 (GOWN DISPOSABLE) ×3
KIT TURNOVER KIT A (KITS) ×2 IMPLANT
MANIFOLD NEPTUNE II (INSTRUMENTS) ×2 IMPLANT
MESH VENTRALEX ST CIRCLE MED (Mesh General) IMPLANT
MESH VENTRALEX ST CIRCLE SM (Mesh General) IMPLANT
NEEDLE HYPO 22GX1.5 SAFETY (NEEDLE) ×2 IMPLANT
NS IRRIG 500ML POUR BTL (IV SOLUTION) ×2 IMPLANT
PACK BASIN MINOR ARMC (MISCELLANEOUS) ×2 IMPLANT
SUT ETHIBOND NAB MO 7 #0 18IN (SUTURE) ×2 IMPLANT
SUT MNCRL 4-0 (SUTURE) ×1
SUT MNCRL 4-0 27XMFL (SUTURE) ×1
SUT SILK 2 0 SH CR/8 (SUTURE) ×1 IMPLANT
SUT VIC AB 2-0 SH 27 (SUTURE) ×1
SUT VIC AB 2-0 SH 27XBRD (SUTURE) ×1 IMPLANT
SUT VIC AB 3-0 SH 27 (SUTURE) ×1
SUT VIC AB 3-0 SH 27X BRD (SUTURE) ×1 IMPLANT
SUTURE MNCRL 4-0 27XMF (SUTURE) ×1 IMPLANT
SYR 10ML LL (SYRINGE) ×4 IMPLANT
TOWEL OR 17X26 4PK STRL BLUE (TOWEL DISPOSABLE) ×2 IMPLANT

## 2021-02-10 NOTE — H&P (Signed)
Subjective:   CC: Umbilical hernia with obstruction, without gangrene [K42.0]  HPI:  Ernest Dixon is a 36 y.o. male who was referred by Kimberly Glenn Lykins, * for evaluation of above. Symptoms were first noted several months ago. Asymptomatic. Associated with nothing specific, exacerbated by nothing specific.  Lump is not reducible.    Past Medical History:  has no past medical history on file.  Past Surgical History:  Past Surgical History:  Procedure Laterality Date   FINGER SURGERY        Family History: family history is not on file.  Social History:  reports that he has never smoked. He has never used smokeless tobacco. He reports current drug use. He reports that he does not drink alcohol.  Current Medications: currently has no medications in their medication list.  Allergies:  Allergies as of 10/06/2020   (No Known Allergies)    ROS:  A 15 point review of systems was performed and pertinent positives and negatives noted in HPI   Objective:     BP 126/89   Pulse 74   Ht 175.3 cm (5' 9")   Wt 89.4 kg (197 lb)   BMI 29.09 kg/m   Constitutional :  alert, appears stated age, cooperative and no distress  Lymphatics/Throat:  no asymmetry, masses, or scars  Respiratory:  clear to auscultation bilaterally  Cardiovascular:  regular rate and rhythm  Gastrointestinal: soft, non-tender; bowel sounds normal; no masses,  no organomegaly. umbilical hernia noted.  moderate, no overlying skin changes and TTP only with attempt at reduction  Musculoskeletal: Steady gait and movement  Skin: Cool and moist  Psychiatric: Normal affect, non-agitated, not confused       LABS:  n/a   RADS: n/a Assessment:       Umbilical hernia with obstruction, without gangrene [K42.0]  Plan:     1. Umbilical hernia with obstruction, without gangrene [K42.0]   Discussed the risk of surgery including recurrence, which can be up to 50% in the case of incisional or complex hernias,  possible use of prosthetic materials (mesh) and the increased risk of mesh infxn if used, bleeding, chronic pain, post-op infxn, post-op SBO or ileus, and possible re-operation to address said risks. The risks of general anesthetic, if used, includes MI, CVA, sudden death or even reaction to anesthetic medications also discussed. Alternatives include continued observation.  Benefits include possible symptom relief, prevention of incarceration, strangulation, enlargement in size over time, and the risk of emergency surgery in the face of strangulation.   Typical post-op recovery time of 3-5 days with 3-4 weeks of activity restrictions were also discussed.    Pt had additional question regarding mesh use and indications, concerns of family members with issues with them.  We discussed specific type of mesh used for this type of repair and good safety record and no official restrictions of its use at this time.  ED return precautions given for sudden increase in pain, size of hernia with accompanying fever, nausea, and/or vomiting.  The patient verbalized understanding and all questions were answered to the patient's satisfaction.  2. Patient has elected to proceed with surgical treatment. Procedure will be scheduled.  Written consent was obtained.. open due to size and self pay.  

## 2021-02-10 NOTE — Transfer of Care (Signed)
Immediate Anesthesia Transfer of Care Note  Patient: Ernest Dixon  Procedure(s) Performed: HERNIA REPAIR UMBILICAL ADULT (Abdomen)  Patient Location: PACU  Anesthesia Type:General  Level of Consciousness: sedated  Airway & Oxygen Therapy: Patient Spontanous Breathing and Patient connected to face mask oxygen  Post-op Assessment: Report given to RN and Post -op Vital signs reviewed and stable  Post vital signs: Reviewed and stable  Last Vitals:  Vitals Value Taken Time  BP 123/85 02/10/21 0908  Temp    Pulse 57 02/10/21 0909  Resp 18 02/10/21 0909  SpO2 100 % 02/10/21 0909  Vitals shown include unvalidated device data.  Last Pain:  Vitals:   02/10/21 0728  TempSrc: Temporal  PainSc: 0-No pain         Complications: No notable events documented.

## 2021-02-10 NOTE — Discharge Instructions (Addendum)
Hernia repair, Care After This sheet gives you information about how to care for yourself after your procedure. Your health care provider may also give you more specific instructions. If you have problems or questions, contact your health care provider. What can I expect after the procedure? After your procedure, it is common to have the following: Pain in your abdomen, especially in the incision areas. You will be given medicine to control the pain. Tiredness. This is a normal part of the recovery process. Your energy level will return to normal over the next several weeks. Changes in your bowel movements, such as constipation or needing to go more often. Talk with your health care provider about how to manage this. Follow these instructions at home: Medicines  tylenol and advil as needed for discomfort.  Please alternate between the two every four hours as needed for pain.    Use narcotics, if prescribed, only when tylenol and motrin is not enough to control pain.  325-650mg every 8hrs to max of 3000mg/24hrs (including the 325mg in every norco dose) for the tylenol.    Advil up to 800mg per dose every 8hrs as needed for pain.   PLEASE RECORD NUMBER OF PILLS TAKEN UNTIL NEXT FOLLOW UP APPT.  THIS WILL HELP DETERMINE HOW READY YOU ARE TO BE RELEASED FROM ANY ACTIVITY RESTRICTIONS Do not drive or use heavy machinery while taking prescription pain medicine. Do not drink alcohol while taking prescription pain medicine.  Incision care    Follow instructions from your health care provider about how to take care of your incision areas. Make sure you: Keep your incisions clean and dry. Wash your hands with soap and water before and after applying medicine to the areas, and before and after changing your bandage (dressing). If soap and water are not available, use hand sanitizer. Change your dressing as told by your health care provider. Leave stitches (sutures), skin glue, or adhesive strips in  place. These skin closures may need to stay in place for 2 weeks or longer. If adhesive strip edges start to loosen and curl up, you may trim the loose edges. Do not remove adhesive strips completely unless your health care provider tells you to do that. Do not wear tight clothing over the incisions. Tight clothing may rub and irritate the incision areas, which may cause the incisions to open. Do not take baths, swim, or use a hot tub until your health care provider approves. OK TO SHOWER IN 24HRS.   Check your incision area every day for signs of infection. Check for: More redness, swelling, or pain. More fluid or blood. Warmth. Pus or a bad smell. Activity Avoid lifting anything that is heavier than 10 lb (4.5 kg) for 2 weeks or until your health care provider says it is okay. No pushing/pulling greater than 30lbs You may resume normal activities as told by your health care provider. Ask your health care provider what activities are safe for you. Take rest breaks during the day as needed. Eating and drinking Follow instructions from your health care provider about what you can eat after surgery. To prevent or treat constipation while you are taking prescription pain medicine, your health care provider may recommend that you: Drink enough fluid to keep your urine clear or pale yellow. Take over-the-counter or prescription medicines. Eat foods that are high in fiber, such as fresh fruits and vegetables, whole grains, and beans. Limit foods that are high in fat and processed sugars, such as fried and   sweet foods. General instructions Ask your health care provider when you will need an appointment to get your sutures or staples removed. Keep all follow-up visits as told by your health care provider. This is important. Contact a health care provider if: You have more redness, swelling, or pain around your incisions. You have more fluid or blood coming from the incisions. Your incisions feel  warm to the touch. You have pus or a bad smell coming from your incisions or your dressing. You have a fever. You have an incision that breaks open (edges not staying together) after sutures or staples have been removed. You develop a rash. You have chest pain or difficulty breathing. You have pain or swelling in your legs. You feel light-headed or you faint. Your abdomen swells (becomes distended). You have nausea or vomiting. You have blood in your stool (feces). This information is not intended to replace advice given to you by your health care provider. Make sure you discuss any questions you have with your health care provider. Document Released: 08/05/2004 Document Revised: 10/05/2017 Document Reviewed: 10/18/2015 Elsevier Interactive Patient Education  2019 [employer]   AMBULATORY SURGERY  DISCHARGE INSTRUCTIONS   The drugs that you were given will stay in your system until tomorrow so for the next 24 hours you should not:  Drive an automobile Make any legal decisions Drink any alcoholic beverage   You may resume regular meals tomorrow.  Today it is better to start with liquids and gradually work up to solid foods.  You may eat anything you prefer, but it is better to start with liquids, then soup and crackers, and gradually work up to solid foods.   Please notify your doctor immediately if you have any unusual bleeding, trouble breathing, redness and pain at the surgery site, drainage, fever, or pain not relieved by medication.    Additional Instructions:        Please contact your physician with any problems or Same Day Surgery at [phone], Monday through Friday 6 am to 4 pm, or [place] [facility] [place] Main number at [phone].  

## 2021-02-10 NOTE — Anesthesia Preprocedure Evaluation (Signed)
Anesthesia Evaluation  Patient identified by MRN, date of birth, ID band Patient awake    Reviewed: Allergy & Precautions, NPO status , Patient's Chart, lab work & pertinent test results  History of Anesthesia Complications Negative for: history of anesthetic complications  Airway Mallampati: III  TM Distance: >3 FB Neck ROM: full    Dental  (+) Chipped, Poor Dentition, Missing, Implants   Pulmonary neg pulmonary ROS, neg shortness of breath,    Pulmonary exam normal        Cardiovascular (-) angina(-) Past MI and (-) DOE negative cardio ROS Normal cardiovascular exam     Neuro/Psych negative neurological ROS  negative psych ROS   GI/Hepatic negative GI ROS, Neg liver ROS, neg GERD  ,  Endo/Other  negative endocrine ROS  Renal/GU      Musculoskeletal   Abdominal   Peds  Hematology negative hematology ROS (+)   Anesthesia Other Findings Obese  History reviewed. No pertinent surgical history.  BMI    Body Mass Index: 28.28 kg/m      Reproductive/Obstetrics negative OB ROS                             Anesthesia Physical Anesthesia Plan  ASA: 2  Anesthesia Plan: General ETT   Post-op Pain Management:    Induction: Intravenous  PONV Risk Score and Plan: Ondansetron, Dexamethasone, Midazolam and Treatment may vary due to age or medical condition  Airway Management Planned: Oral ETT  Additional Equipment:   Intra-op Plan:   Post-operative Plan: Extubation in OR  Informed Consent: I have reviewed the patients History and Physical, chart, labs and discussed the procedure including the risks, benefits and alternatives for the proposed anesthesia with the patient or authorized representative who has indicated his/her understanding and acceptance.     Dental Advisory Given  Plan Discussed with: Anesthesiologist, CRNA and Surgeon  Anesthesia Plan Comments: (Patient  consented for risks of anesthesia including but not limited to:  - adverse reactions to medications - damage to eyes, teeth, lips or other oral mucosa - nerve damage due to positioning  - sore throat or hoarseness - Damage to heart, brain, nerves, lungs, other parts of body or loss of life  Patient voiced understanding.)        Anesthesia Quick Evaluation

## 2021-02-10 NOTE — Op Note (Signed)
Preoperative diagnosis: umbilical hernia, incarcerated Postoperative diagnosis: same  Procedure:  Open umbilical hernia repair  Anesthesia: LMA  Surgeon: Sung Amabile  Wound Classification: Clean  Specimen: none  Complications: None  Estimated Blood Loss: minimal  Indications:see HPI  Findings: 1.2cm x 1.3cm incarcerated umbilical hernia 2. Tension free repair achieved with suture 3. Adequate hemostasis  Description of procedure: The patient was brought to the operating room and general anesthesia was induced. A time-out was completed verifying correct patient, procedure, site, positioning, and implant(s) and/or special equipment prior to beginning this procedure. Antibiotics were administered prior to making the incision. SCDs placed. The anterior abdominal wall was prepped and draped in the standard sterile fashion.   An supraumbilical incision was made after infusing the preplanned incision with half percent Marcaine.  Dissection carried down to fascia where the umbilical stalk was noted.  The stalk was transected and there was noted to be a 1.2cm x 1.3cm umbilical hernia.  The preperitoneal fat contents were dissected off the surrounding structures and reduced.  Hemostasis was confirmed prior to reducing the actual contents.  The defect itself was primary closed using 0 Ethibond in an interrupted fashion. After confirming hemostasis, the umbilical stalk was reattached to the abdominal wall using 2-0 Vicryl and the wound was irrigated. the fascia coated with zynrelef and closed in a multilayer fashion, using 3-0 Vicryl for the deep dermal layer in an interrupted fashion and running 4-0 Monocryl in a subcuticular fashion.  Wound was then dressed with Dermabond.  Patient was then successfully awakened and transferred to PACU in stable condition.  At the end of the procedure sponge and instrument counts were correct

## 2021-02-10 NOTE — Anesthesia Postprocedure Evaluation (Signed)
Anesthesia Post Note  Patient: Ernest Dixon  Procedure(s) Performed: HERNIA REPAIR UMBILICAL ADULT (Abdomen)  Patient location during evaluation: PACU Anesthesia Type: General Level of consciousness: awake and alert Pain management: pain level controlled Vital Signs Assessment: post-procedure vital signs reviewed and stable Respiratory status: spontaneous breathing, nonlabored ventilation, respiratory function stable and patient connected to nasal cannula oxygen Cardiovascular status: blood pressure returned to baseline and stable Postop Assessment: no apparent nausea or vomiting Anesthetic complications: no   No notable events documented.   Last Vitals:  Vitals:   02/10/21 0945 02/10/21 1000  BP: 118/71 135/79  Pulse: 64 83  Resp: 11 16  Temp:  (!) 36.2 C  SpO2: 99% 97%    Last Pain:  Vitals:   02/10/21 1000  TempSrc:   PainSc: 4                  Cleda Mccreedy Rilla Buckman

## 2021-02-10 NOTE — Anesthesia Procedure Notes (Signed)
Procedure Name: Intubation Date/Time: 02/10/2021 8:17 AM Performed by: Nelda Marseille, CRNA Pre-anesthesia Checklist: Patient identified, Patient being monitored, Timeout performed, Emergency Drugs available and Suction available Patient Re-evaluated:Patient Re-evaluated prior to induction Oxygen Delivery Method: Circle system utilized Preoxygenation: Pre-oxygenation with 100% oxygen Induction Type: IV induction Ventilation: Mask ventilation without difficulty Laryngoscope Size: Mac, 3 and McGraph Grade View: Grade I Tube type: Oral Tube size: 7.5 mm Number of attempts: 1 Airway Equipment and Method: Stylet Placement Confirmation: ETT inserted through vocal cords under direct vision, positive ETCO2 and breath sounds checked- equal and bilateral Secured at: 21 cm Tube secured with: Tape Dental Injury: Teeth and Oropharynx as per pre-operative assessment

## 2021-02-13 ENCOUNTER — Telehealth: Payer: Self-pay | Admitting: Surgery

## 2021-02-13 NOTE — Telephone Encounter (Signed)
02/13/21  Patient called tonight reporting that he was having a wound issue.  He's s/p open umbilical hernia repair with Dr. Tonna Boehringer on 02/10/21.  He reports noticing today the development of a blood blister at the incision.  Denies any bloody drainage, but does report some mild amount of yellowish color fluid.  Denies any worsening pain.  Instructed that this could be a possible blister from the cautery used during surgery.  Recommended that he apply Neosporin or Triple Antibiotic over the blister area and cover with dry gauze dressing.  Advised to call Dr. Geoffery Lyons office in the morning to see if he needs to schedule a sooner appointment or if there is any worsening.  Henrene Dodge, MD

## 2021-02-20 ENCOUNTER — Telehealth: Payer: Self-pay | Admitting: Surgery

## 2021-02-20 NOTE — Telephone Encounter (Signed)
S/p primary repair of UH by Dr. Tonna Boehringer 1/12 calling since feels hernia is back. No fevers, taking PO. Advice to call office tomorrow for F/U

## 2022-04-04 ENCOUNTER — Ambulatory Visit: Payer: BC Managed Care – PPO | Admitting: Urology

## 2022-04-05 ENCOUNTER — Encounter: Payer: Self-pay | Admitting: Urology

## 2022-04-05 ENCOUNTER — Ambulatory Visit (INDEPENDENT_AMBULATORY_CARE_PROVIDER_SITE_OTHER): Payer: BC Managed Care – PPO | Admitting: Urology

## 2022-04-05 VITALS — BP 141/89 | HR 75 | Ht 70.0 in | Wt 195.0 lb

## 2022-04-05 DIAGNOSIS — Z3009 Encounter for other general counseling and advice on contraception: Secondary | ICD-10-CM | POA: Diagnosis not present

## 2022-04-05 MED ORDER — DIAZEPAM 10 MG PO TABS: 10.0000 mg | ORAL_TABLET | Freq: Once | ORAL | 0 refills | Status: AC

## 2022-04-05 NOTE — Progress Notes (Signed)
04/05/2022 9:23 AM   Ernest Dixon 09/05/1985 [id number]  Referring provider: No referring provider defined for this encounter.  Chief Complaint  Patient presents with   VAS Consult    HPI: 37 y.o. year old male referred for further evaluation of possible vasectomy.  He denies a history of testicular trauma or pain.  No urinary issues.  No previous scrotal surgeries.   PMH: History reviewed. No pertinent past medical history.  Surgical History: Past Surgical History:  Procedure Laterality Date   UMBILICAL HERNIA REPAIR N/A 02/10/2021   Procedure: HERNIA REPAIR UMBILICAL ADULT;  Surgeon: Benjamine Sprague, DO;  Location: [facility] ORS;  Service: General;  Laterality: N/A;    Home Medications:  Allergies as of 04/05/2022   No Known Allergies      Medication List        Accurate as of April 05, 2022  9:23 AM. If you have any questions, ask your nurse or doctor.          STOP taking these medications    HYDROcodone-acetaminophen 5-325 MG tablet Commonly known as: Norco Stopped by: Hollice Espy, MD   ibuprofen 800 MG tablet Commonly known as: ADVIL Stopped by: Hollice Espy, MD        Allergies: No Known Allergies  Family History: Family History  Problem Relation Age of Onset   Diabetes Mother    Hypertension Mother    Hypertension Father    Cancer Maternal Aunt    Lupus Maternal Aunt     Social History:  reports that he has never smoked. He has never used smokeless tobacco. He reports current alcohol use of about 1.0 standard drink of alcohol per week. He reports current drug use. Drug: Marijuana.   Physical Exam: BP (!) 141/89   Pulse 75   Ht '5\' 10"'$  (1.778 m)   Wt 195 lb (88.5 kg)   BMI 27.98 kg/m   Constitutional:  Alert and oriented, No acute distress. HEENT: [place] AT, moist mucus membranes.  Trachea midline, no masses. Cardiovascular: No clubbing, cyanosis, or edema. Respiratory: Normal respiratory effort, no increased work of  breathing. GI: Abdomen is soft, nontender, nondistended, no abdominal masses GU: Normal phallus.  Bilateral descended testicles without masses.  Vasa easily palpable bilaterally. Skin: No rashes, bruises or suspicious lesions. Neurologic: Grossly intact, no focal deficits, moving all 4 extremities. Psychiatric: Normal mood and affect.   Assessment & Plan:    1. Vasectomy evaluation Today, we discussed what the vas deferens is, where it is located, and its function. We reviewed the procedure for vasectomy, it's risks, benefits, alternatives, and likelihood of achieving his goals. We discussed in detail the procedure, complications, and recovery as well as the need for clearance prior to unprotected intercourse. We discussed that vasectomy does not protect against sexually transmitted diseases. We discussed that this procedure does not result in immediate sterility and that they would need to use other forms of birth control until he has been cleared with negative postvasectomy semen analyses. I explained that the procedure is considered to be permanent and that attempts at reversal have varying degrees of success. These options include vasectomy reversal, sperm retrieval, and in vitro fertilization; these can be very expensive. We discussed the chance of postvasectomy pain syndrome which occurs in less than 5% of patients. I explained to the patient that there is no treatment to resolve this chronic pain, and that if it developed I would not be able to help resolve the issue, but that surgery is generally  not needed for correction. I explained there have even been reports of systemic like illness associated with this chronic pain, and that there was no good cure. I explained that vasectomy it is not a 100% reliable form of birth control, and the risk of pregnancy after vasectomy is approximately 1 in 2000 men who had a negative postvasectomy semen analysis or rare non-motile sperm. I explained that repeat  vasectomy was necessary in less than 1% of vasectomy procedures when employing the type of technique that I use. I explained that he should refrain from ejaculation for approximately one week following vasectomy. I explained that there are other options for birth control which are permanent and non-permanent; we discussed these. I explained the rates of surgical complications, such as symptomatic hematoma or infection, are low (1-2%) and vary with the surgeon's experience and criteria used to diagnose the complication.   The patient had the opportunity to ask questions to his stated satisfaction. He voiced understanding of the above factors and stated that he has read all the information provided to him and the packets and informed consent.  He is interested in receiving of Valium 10 mg prior to the procedure for the purpose of anxiolysis.  A prescription was given today.  He will have a driver on the day of the procedure.    Hollice Espy, MD  [facility] [address], [place], [place] [address] [phone]

## 2022-05-23 ENCOUNTER — Ambulatory Visit (INDEPENDENT_AMBULATORY_CARE_PROVIDER_SITE_OTHER): Payer: BC Managed Care – PPO | Admitting: Urology

## 2022-05-23 ENCOUNTER — Other Ambulatory Visit: Payer: Self-pay | Admitting: Urology

## 2022-05-23 VITALS — BP 143/86 | HR 69 | Ht 69.0 in | Wt 195.0 lb

## 2022-05-23 DIAGNOSIS — Z302 Encounter for sterilization: Secondary | ICD-10-CM

## 2022-05-23 NOTE — Progress Notes (Signed)
05/23/22  Patient presented for vasectomy today however was tearful, anxious, and not able to undergo the procedure.  Given his intolerance, he would like to still undergo the procedure but under sedation.  Will arrange for this.  Vanna Scotland, MD

## 2022-05-23 NOTE — Progress Notes (Unsigned)
Surgical Physician Order Form [name]  * Scheduling expectation : Next Available  *Length of Case:   *Clearance needed: no  *Anticoagulation Instructions: Hold all anticoagulants  *Aspirin Instructions: Hold Aspirin  *Post-op visit Date/Instructions:   SA drop off  *Diagnosis:  Desires vasectomy  *Procedure: bilateral Vasectomy (10272)   Additional orders: N/A  -Admit type: OUTpatient  -Anesthesia: General  -VTE Prophylaxis Standing Order SCD's       Other:   -Standing Lab Orders Per Anesthesia    Lab other: None  -Standing Test orders EKG/Chest x-ray per Anesthesia       Test other:   - Medications:  Ancef 2gm IV  -Other orders:  N/A

## 2022-05-23 NOTE — Patient Instructions (Signed)

## 2022-05-24 ENCOUNTER — Telehealth: Payer: Self-pay

## 2022-05-24 NOTE — Telephone Encounter (Signed)
Left Detailed message to return my call to schedule surgery, will call back.

## 2022-05-24 NOTE — Progress Notes (Signed)
   [facility] Surgical Posting Form  Surgery Date: Date: 06/19/2022  Surgeon: Dr. Vanna Scotland, MD  Inpt ( No  )   Outpt (Yes)   Obs ( No  )   Diagnosis: Z30.2 Desires Vasectomy  -CPT: [phone]  Surgery: Bilateral Vasectomy  Stop Anticoagulations: Yes and also hold ASA  Cardiac/Medical/Pulmonary Clearance needed: no  *Orders entered into EPIC  Date: 05/24/22   *Case booked in [place]  Date: 05/24/22  *Notified pt of Surgery: Date: 05/24/22  PRE-OP UA & CX: no  *Placed into Prior Authorization Work [place] Date: 05/24/22  Assistant/laser/rep:No

## 2022-05-24 NOTE — Telephone Encounter (Signed)
I spoke with Mr. Ernest Dixon. We have discussed possible surgery dates and Monday May 20th, 2024 was agreed upon by all parties. Patient given information about surgery date, what to expect pre-operatively and post operatively.  We discussed that a Pre-Admission Testing office will be calling to set up the pre-op visit that will take place prior to surgery, and that these appointments are typically done over the phone with a Pre-Admissions RN. Informed patient that our office will communicate any additional care to be provided after surgery. Patients questions or concerns were discussed during our call. Advised to call our office should there be any additional information, questions or concerns that arise. Patient verbalized understanding.

## 2022-06-12 ENCOUNTER — Encounter
Admission: RE | Admit: 2022-06-12 | Discharge: 2022-06-12 | Disposition: A | Discharge status: Home / Self Care | Payer: BC Managed Care – PPO | Source: Ambulatory Visit | Attending: Urology | Admitting: Urology

## 2022-06-12 ENCOUNTER — Other Ambulatory Visit: Payer: Self-pay

## 2022-06-12 NOTE — Patient Instructions (Addendum)
Your procedure is scheduled on: Monday May 20, 20243 Report to the Registration Desk on the 1st floor of the Medical Mall. To find out your arrival time, please call [phone] between 1PM - 3PM on: Friday Jun 16, 2022. If your arrival time is 6:00 am, do not arrive before that time as the Medical Mall entrance doors do not open until 6:00 am.  REMEMBER: Instructions that are not followed completely may result in serious medical risk, up to and including death; or upon the discretion of your surgeon and anesthesiologist your surgery may need to be rescheduled.  Do not eat food or drink fluids after midnight the night before surgery.  No gum chewing or hard candies.   One week prior to surgery: Stop Anti-inflammatories (NSAIDS) such as Advil, Aleve, Ibuprofen, Motrin, Naproxen, Naprosyn and Aspirin based products such as Excedrin, Goody's Powder, BC Powder. Stop ANY OVER THE COUNTER supplements until after surgery. You may however, continue to take Tylenol if needed for pain up until the day of surgery.  Continue taking all prescribed medications with the exception of the following:   Follow recommendations from Cardiologist or PCP regarding stopping blood thinners.  TAKE ONLY THESE MEDICATIONS THE MORNING OF SURGERY WITH A SIP OF WATER:  None   Use inhalers on the day of surgery and bring to the hospital.   No Alcohol for 24 hours before or after surgery.  No Smoking including e-cigarettes for 24 hours before surgery.  No chewable tobacco products for at least 6 hours before surgery.  No nicotine patches on the day of surgery.  Do not use any "recreational" drugs for at least a week (preferably 2 weeks) before your surgery.  Please be advised that the combination of cocaine and anesthesia may have negative outcomes, up to and including death. If you test positive for cocaine, your surgery will be cancelled.  On the morning of surgery brush your teeth with toothpaste and  water, you may rinse your mouth with mouthwash if you wish. Do not swallow any toothpaste or mouthwash.  Use CHG Soap or wipes as directed on instruction sheet.  Do not wear jewelry, make-up, hairpins, clips or nail polish.  Do not wear lotions, powders, or perfumes.   Do not shave body hair from the neck down 48 hours before surgery.  Contact lenses, hearing aids and dentures may not be worn into surgery.  Do not bring valuables to the hospital. [facility] is not responsible for any missing/lost belongings or valuables.   Notify your doctor if there is any change in your medical condition (cold, fever, infection).  Wear comfortable clothing (specific to your surgery type) to the hospital.  After surgery, you can help prevent lung complications by doing breathing exercises.  Take deep breaths and cough every 1-2 hours. Your doctor may order a device called an Incentive Spirometer to help you take deep breaths. When coughing or sneezing, hold a pillow firmly against your incision with both hands. This is called "splinting." Doing this helps protect your incision. It also decreases belly discomfort.  If you are being admitted to the hospital overnight, leave your suitcase in the car. After surgery it may be brought to your room.  In case of increased patient census, it may be necessary for you, the patient, to continue your postoperative care in the Same Day Surgery department.  If you are being discharged the day of surgery, you will not be allowed to drive home. You will need a responsible  individual to drive you home and stay with you for 24 hours after surgery.   If you are taking public transportation, you will need to have a responsible individual with you.  Please call the Pre-admissions Testing Dept. at [phone] if you have any questions about these instructions.  Surgery Visitation Policy:  Patients having surgery or a procedure may have two visitors.  Children  under the age of 41 must have an adult with them who is not the patient.  Inpatient Visitation:    Visiting hours are 7 a.m. to 8 p.m. Up to four visitors are allowed at one time in a patient room. The visitors may rotate out with other people during the day.  One visitor age 15 or older may stay with the patient overnight and must be in the room by 8 p.m.

## 2022-06-19 ENCOUNTER — Ambulatory Visit: Payer: BC Managed Care – PPO | Admitting: Certified Registered"

## 2022-06-19 ENCOUNTER — Ambulatory Visit
Admission: RE | Admit: 2022-06-19 | Discharge: 2022-06-19 | Disposition: A | Discharge status: Home / Self Care | Payer: BC Managed Care – PPO | Attending: Urology | Admitting: Urology

## 2022-06-19 ENCOUNTER — Encounter
Admission: RE | Disposition: A | Discharge status: Home / Self Care | Payer: Self-pay | Source: Home / Self Care | Attending: Urology

## 2022-06-19 ENCOUNTER — Other Ambulatory Visit: Payer: Self-pay

## 2022-06-19 ENCOUNTER — Encounter: Payer: Self-pay | Admitting: Urology

## 2022-06-19 DIAGNOSIS — Z302 Encounter for sterilization: Secondary | ICD-10-CM

## 2022-06-19 DIAGNOSIS — F419 Anxiety disorder, unspecified: Secondary | ICD-10-CM | POA: Diagnosis not present

## 2022-06-19 HISTORY — PX: VASECTOMY: SHX75

## 2022-06-19 SURGERY — VASECTOMY
Anesthesia: General | Laterality: Bilateral

## 2022-06-19 MED ORDER — PROPOFOL 10 MG/ML IV BOLUS
INTRAVENOUS | Status: DC | PRN
  Administered 2022-06-19: 20 mg via INTRAVENOUS

## 2022-06-19 MED ORDER — LIDOCAINE HCL (PF) 1 % IJ SOLN
INTRAMUSCULAR | Status: DC | PRN
  Administered 2022-06-19: 6 mL

## 2022-06-19 MED ORDER — MIDAZOLAM HCL 2 MG/2ML IJ SOLN
INTRAMUSCULAR | Status: AC
  Filled 2022-06-19: qty 2

## 2022-06-19 MED ORDER — OXYCODONE HCL 5 MG PO TABS: 5.0000 mg | ORAL_TABLET | Freq: Once | ORAL | Status: DC | PRN

## 2022-06-19 MED ORDER — PROPOFOL 500 MG/50ML IV EMUL
INTRAVENOUS | Status: DC | PRN
  Administered 2022-06-19: 100 ug/kg/min via INTRAVENOUS

## 2022-06-19 MED ORDER — MIDAZOLAM HCL 2 MG/2ML IJ SOLN
INTRAMUSCULAR | Status: DC | PRN
  Administered 2022-06-19: 2 mg via INTRAVENOUS

## 2022-06-19 MED ORDER — FENTANYL CITRATE (PF) 100 MCG/2ML IJ SOLN
INTRAMUSCULAR | Status: DC | PRN
  Administered 2022-06-19 (×2): 25 ug via INTRAVENOUS

## 2022-06-19 MED ORDER — LIDOCAINE HCL (PF) 1 % IJ SOLN
INTRAMUSCULAR | Status: AC
  Filled 2022-06-19: qty 30

## 2022-06-19 MED ORDER — LACTATED RINGERS IV SOLN: INTRAVENOUS | Status: DC

## 2022-06-19 MED ORDER — CHLORHEXIDINE GLUCONATE 0.12 % MT SOLN: 15.0000 mL | Freq: Once | OROMUCOSAL | Status: DC

## 2022-06-19 MED ORDER — LIDOCAINE HCL (PF) 2 % IJ SOLN
INTRAMUSCULAR | Status: AC
  Filled 2022-06-19: qty 5

## 2022-06-19 MED ORDER — PROPOFOL 1000 MG/100ML IV EMUL
INTRAVENOUS | Status: AC
  Filled 2022-06-19: qty 100

## 2022-06-19 MED ORDER — FAMOTIDINE 20 MG PO TABS: 20.0000 mg | ORAL_TABLET | Freq: Once | ORAL | Status: DC

## 2022-06-19 MED ORDER — ORAL CARE MOUTH RINSE: 15.0000 mL | Freq: Once | OROMUCOSAL | Status: DC

## 2022-06-19 MED ORDER — OXYCODONE HCL 5 MG/5ML PO SOLN: 5.0000 mg | Freq: Once | ORAL | Status: DC | PRN

## 2022-06-19 MED ORDER — FENTANYL CITRATE (PF) 100 MCG/2ML IJ SOLN: 25.0000 ug | INTRAMUSCULAR | Status: DC | PRN

## 2022-06-19 MED ORDER — FENTANYL CITRATE (PF) 100 MCG/2ML IJ SOLN
INTRAMUSCULAR | Status: AC
  Filled 2022-06-19: qty 2

## 2022-06-19 MED ORDER — CEFAZOLIN SODIUM-DEXTROSE 2-4 GM/100ML-% IV SOLN: 2.0000 g | INTRAVENOUS | Status: DC

## 2022-06-19 SURGICAL SUPPLY — 34 items
ADH SKN CLS APL DERMABOND .7 (GAUZE/BANDAGES/DRESSINGS) ×1
APL PRP STRL LF DISP 70% ISPRP (MISCELLANEOUS) ×1
BLADE CLIPPER SURG (BLADE) ×1 IMPLANT
BLADE SURG 15 STRL LF DISP TIS (BLADE) ×1 IMPLANT
BLADE SURG 15 STRL SS (BLADE)
BLADE SURG SZ11 CARB STEEL (BLADE) ×1 IMPLANT
CHLORAPREP W/TINT 26 (MISCELLANEOUS) ×1 IMPLANT
DERMABOND ADVANCED .7 DNX12 (GAUZE/BANDAGES/DRESSINGS) ×1 IMPLANT
DRAPE LAPAROTOMY 77X122 PED (DRAPES) ×1 IMPLANT
ELECT CAUTERY NEEDLE TIP 1.0 (MISCELLANEOUS) ×1
ELECT REM PT RETURN 9FT ADLT (ELECTROSURGICAL) ×1
ELECTRODE CAUTERY NEDL TIP 1.0 (MISCELLANEOUS) ×1 IMPLANT
ELECTRODE REM PT RTRN 9FT ADLT (ELECTROSURGICAL) ×1 IMPLANT
GLOVE BIO SURGEON STRL SZ 6.5 (GLOVE) ×1 IMPLANT
GOWN STRL REUS W/ TWL LRG LVL3 (GOWN DISPOSABLE) ×2 IMPLANT
GOWN STRL REUS W/TWL LRG LVL3 (GOWN DISPOSABLE) ×2
KIT TURNOVER KIT A (KITS) ×1 IMPLANT
LABEL OR SOLS (LABEL) ×1 IMPLANT
MANIFOLD NEPTUNE II (INSTRUMENTS) ×1 IMPLANT
NDL HYPO 25X1 1.5 SAFETY (NEEDLE) ×1 IMPLANT
NEEDLE HYPO 25X1 1.5 SAFETY (NEEDLE) ×1 IMPLANT
NS IRRIG 500ML POUR BTL (IV SOLUTION) ×1 IMPLANT
PACK BASIN MINOR ARMC (MISCELLANEOUS) ×1 IMPLANT
SUCTION FRAZIER HANDLE 10FR (MISCELLANEOUS)
SUCTION TUBE FRAZIER 10FR DISP (MISCELLANEOUS) IMPLANT
SUPPORT SCROTAL LG STRP (MISCELLANEOUS) ×1 IMPLANT
SUT CHROMIC 3 0 PS 2 (SUTURE) IMPLANT
SUT CHROMIC 4 0 RB 1X27 (SUTURE) ×1 IMPLANT
SUT ETHILON NAB PS2 4-0 18IN (SUTURE) IMPLANT
SUT VIC AB 3-0 SH 27 (SUTURE)
SUT VIC AB 3-0 SH 27X BRD (SUTURE) IMPLANT
SYR 10ML LL (SYRINGE) ×1 IMPLANT
TRAP FLUID SMOKE EVACUATOR (MISCELLANEOUS) ×1 IMPLANT
WATER STERILE IRR 500ML POUR (IV SOLUTION) ×1 IMPLANT

## 2022-06-19 NOTE — Op Note (Signed)
Date of procedure: 06/19/22  Preoperative diagnosis:  Desires vasectomy  Postoperative diagnosis:  Same as above  Procedure: Bilateral vasectomy  Surgeon: Vanna Scotland, MD  Anesthesia: MAC  Complications: None  Intraoperative findings: Normal GU anatomy  EBL: Minimal  Specimens: None  Drains: None  Indication: Ernest Dixon is a 37 y.o. patient with anxiety unable to tolerate office vasectomy.  After reviewing the management options for treatment, he elected to proceed with the above surgical procedure(s). We have discussed the potential benefits and risks of the procedure, side effects of the proposed treatment, the likelihood of the patient achieving the goals of the procedure, and any potential problems that might occur during the procedure or recuperation. Informed consent has been obtained.  Description of procedure:  The patient was taken to the operating room and general anesthesia was induced.  The patient was placed in the supine position, prepped and draped in the usual sterile fashion, and preoperative antibiotics were administered. A preoperative time-out was performed.   The right vas was isolated.  Several cc of 1% lidocaine was instilled both into the skin and into the cord itself for cord block.  The vas was dissected and then grasped with a ring forcep.  It was brought to the level of the skin.  The vas deferens was then incised and needlepoint cautery was used within each of the lumen to fulgurate down into the vas tube itself.  A fascial interposition was then achieved by covering the testicular end of the vas with surrounding tissue which was secured in place using 4 chromic suture.  The stricture was returned back into the right hemiscrotum and the skin was closed using a single interrupted 4-0 chromic suture.  The same exact procedure was performed on the left.  There were no complications.  A dressing of 4 x 4's and a scrotal support device were applied.   He was then reversed from anesthesia and taken to the PACU in stable condition.  Vanna Scotland, M.D.

## 2022-06-19 NOTE — Transfer of Care (Signed)
Immediate Anesthesia Transfer of Care Note  Patient: Ernest Dixon  Procedure(s) Performed: VASECTOMY (Bilateral)  Patient Location: PACU  Anesthesia Type:General  Level of Consciousness: drowsy and patient cooperative  Airway & Oxygen Therapy: Patient Spontanous Breathing  Post-op Assessment: Report given to RN and Post -op Vital signs reviewed and stable  Post vital signs: Reviewed and stable  Last Vitals:  Vitals Value Taken Time  BP 116/63 06/19/22 1405  Temp    Pulse 54 06/19/22 1406  Resp 17 06/19/22 1406  SpO2 100 % 06/19/22 1406  Vitals shown include unvalidated device data.  Last Pain:  Vitals:   06/19/22 1246  TempSrc: Oral  PainSc: 0-No pain         Complications: No notable events documented.

## 2022-06-19 NOTE — H&P (Signed)
06/19/22  RRR CTAB  Ernest Dixon December 11, 1985 [id number]   Referring provider: No referring provider defined for this encounter.      Chief Complaint  Patient presents with   VAS Consult      HPI: 37 y.o. year old male referred for further evaluation of possible vasectomy.   He denies a history of testicular trauma or pain.  No urinary issues.  No previous scrotal surgeries.     PMH: History reviewed. No pertinent past medical history.   Surgical History:      Past Surgical History:  Procedure Laterality Date   UMBILICAL HERNIA REPAIR N/A 02/10/2021    Procedure: HERNIA REPAIR UMBILICAL ADULT;  Surgeon: Sung Amabile, DO;  Location: [facility] ORS;  Service: General;  Laterality: N/A;      Home Medications:  Allergies as of 04/05/2022   No Known Allergies         Medication List           Accurate as of April 05, 2022  9:23 AM. If you have any questions, ask your nurse or doctor.              STOP taking these medications     HYDROcodone-acetaminophen 5-325 MG tablet Commonly known as: Norco Stopped by: Vanna Scotland, MD    ibuprofen 800 MG tablet Commonly known as: ADVIL Stopped by: Vanna Scotland, MD             Allergies: No Known Allergies   Family History:      Family History  Problem Relation Age of Onset   Diabetes Mother     Hypertension Mother     Hypertension Father     Cancer Maternal Aunt     Lupus Maternal Aunt        Social History:  reports that he has never smoked. He has never used smokeless tobacco. He reports current alcohol use of about 1.0 standard drink of alcohol per week. He reports current drug use. Drug: Marijuana.     Physical Exam: BP (!) 141/89   Pulse 75   Ht 5\' 10"  (1.778 m)   Wt 195 lb (88.5 kg)   BMI 27.98 kg/m   Constitutional:  Alert and oriented, No acute distress. HEENT: [place] AT, moist mucus membranes.  Trachea midline, no masses. Cardiovascular: No clubbing, cyanosis, or edema. Respiratory: Normal  respiratory effort, no increased work of breathing. GI: Abdomen is soft, nontender, nondistended, no abdominal masses GU: Normal phallus.  Bilateral descended testicles without masses.  Vasa easily palpable bilaterally. Skin: No rashes, bruises or suspicious lesions. Neurologic: Grossly intact, no focal deficits, moving all 4 extremities. Psychiatric: Normal mood and affect.     Assessment & Plan:     1. Vasectomy evaluation Today, we discussed what the vas deferens is, where it is located, and its function. We reviewed the procedure for vasectomy, it's risks, benefits, alternatives, and likelihood of achieving his goals. We discussed in detail the procedure, complications, and recovery as well as the need for clearance prior to unprotected intercourse. We discussed that vasectomy does not protect against sexually transmitted diseases. We discussed that this procedure does not result in immediate sterility and that they would need to use other forms of birth control until he has been cleared with negative postvasectomy semen analyses. I explained that the procedure is considered to be permanent and that attempts at reversal have varying degrees of success. These options include vasectomy reversal, sperm retrieval, and in vitro fertilization; these can be  very expensive. We discussed the chance of postvasectomy pain syndrome which occurs in less than 5% of patients. I explained to the patient that there is no treatment to resolve this chronic pain, and that if it developed I would not be able to help resolve the issue, but that surgery is generally not needed for correction. I explained there have even been reports of systemic like illness associated with this chronic pain, and that there was no good cure. I explained that vasectomy it is not a 100% reliable form of birth control, and the risk of pregnancy after vasectomy is approximately 1 in 2000 men who had a negative postvasectomy semen analysis or  rare non-motile sperm. I explained that repeat vasectomy was necessary in less than 1% of vasectomy procedures when employing the type of technique that I use. I explained that he should refrain from ejaculation for approximately one week following vasectomy. I explained that there are other options for birth control which are permanent and non-permanent; we discussed these. I explained the rates of surgical complications, such as symptomatic hematoma or infection, are low (1-2%) and vary with the surgeon's experience and criteria used to diagnose the complication.    The patient had the opportunity to ask questions to his stated satisfaction. He voiced understanding of the above factors and stated that he has read all the information provided to him and the packets and informed consent.   He is interested in receiving of Valium 10 mg prior to the procedure for the purpose of anxiolysis.  A prescription was given today.  He will have a driver on the day of the procedure.       Vanna Scotland, MD   [facility] [address] [place], [place] [address] [phone]

## 2022-06-19 NOTE — Discharge Instructions (Addendum)
AMBULATORY SURGERY  DISCHARGE INSTRUCTIONS   The drugs that you were given will stay in your system until tomorrow so for the next 24 hours you should not:  Drive an automobile Make any legal decisions Drink any alcoholic beverage   You may resume regular meals tomorrow.  Today it is better to start with liquids and gradually work up to solid foods.  You may eat anything you prefer, but it is better to start with liquids, then soup and crackers, and gradually work up to solid foods.   Please notify your doctor immediately if you have any unusual bleeding, trouble breathing, redness and pain at the surgery site, drainage, fever, or pain not relieved by medication.    Additional Instructions:  Please contact your physician with any problems or Same Day Surgery at [phone], Monday through Friday 6 am to 4 pm, or [place] at [facility] number at [phone].                                                              Vasectomy                                    Patient Education and Post Care   If you were provided a sedative (Valium) you must have someone available to drive you home after your procedure. Avoid strenuous activity for 48 hours after your procedure. This includes any heavy lifting. You may return to work the next day, as long as no strenuous activity is required. Leave bandage in place for the next 24hrs. If you have any difficulty urinating remove you may remove bandage earlier. Then keep area clean and dry. You may shower after 24 hours. Do not take a bath or use a hot tub for five (5) days. You may apply ice or a cold pack to the scrotum as needed, for 10 minutes of every hours. (A bag of frozen peas will mold to the area). This may help reduce any pain or swelling. It may also be helpful to wear supportive underwear, such as briefs. Expect some mild pain, mild swelling of the scrotum and possible slight fluid leak from the site of the puncture. A gauze  pad may be applied to the scrotum if there is any leakage from the puncture site. This drainage may continue for several days and is normal. You may continue your normal diet. After the procedure, you will be given 1-2 specimen cups. You need to do sperm checks at 6 and/or 12 weeks. Please bring the specimen back to our office to be sent out for analysis. You may resume having sex 1 week after procedure, if comfortable enough. Until you are told that you are sterile, it is essential that you use another form of birth control until otherwise notified by our office. Take Extra-Strength Tylenol, Motrin or Advil as needed for any pain or discomfort. Follow the package directions regarding dose. Stronger prescription pain medication is provided, but most people do not find that it is necessary. If you experience unusual or severe pain that is not relieved by pain medication, excessive bleeding or drainage, excessive swelling or redness, foul odor or a fever over 101 F, please contact our  office.  [facility] [place], [place] [address] [phone]

## 2022-06-19 NOTE — Anesthesia Procedure Notes (Signed)
Procedure Name: MAC Date/Time: 06/19/2022 1:35 PM  Performed by: Elmarie Mainland, CRNAPre-anesthesia Checklist: Patient identified, Emergency Drugs available, Suction available and Patient being monitored Patient Re-evaluated:Patient Re-evaluated prior to induction Oxygen Delivery Method: Simple face mask

## 2022-06-19 NOTE — Anesthesia Preprocedure Evaluation (Signed)
Anesthesia Evaluation  Patient identified by MRN, date of birth, ID band Patient awake    Reviewed: Allergy & Precautions, NPO status , Patient's Chart, lab work & pertinent test results  Airway Mallampati: III  TM Distance: >3 FB Neck ROM: full    Dental  (+) Chipped   Pulmonary neg pulmonary ROS, Patient abstained from smoking.   Pulmonary exam normal        Cardiovascular negative cardio ROS Normal cardiovascular exam     Neuro/Psych negative neurological ROS  negative psych ROS   GI/Hepatic negative GI ROS, Neg liver ROS,,,  Endo/Other  negative endocrine ROS    Renal/GU negative Renal ROS  negative genitourinary   Musculoskeletal   Abdominal   Peds  Hematology negative hematology ROS (+)   Anesthesia Other Findings History reviewed. No pertinent past medical history.  Past Surgical History: 02/10/2021: UMBILICAL HERNIA REPAIR; N/A     Comment:  Procedure: HERNIA REPAIR UMBILICAL ADULT;  Surgeon:               Sung Amabile, DO;  Location: [facility] ORS;  Service: General;              Laterality: N/A;  BMI    Body Mass Index: 28.40 kg/m      Reproductive/Obstetrics negative OB ROS                             Anesthesia Physical Anesthesia Plan  ASA: 1  Anesthesia Plan: General   Post-op Pain Management:    Induction: Intravenous  PONV Risk Score and Plan: Propofol infusion, Midazolam and TIVA  Airway Management Planned: Natural Airway and Nasal Cannula  Additional Equipment:   Intra-op Plan:   Post-operative Plan:   Informed Consent: I have reviewed the patients History and Physical, chart, labs and discussed the procedure including the risks, benefits and alternatives for the proposed anesthesia with the patient or authorized representative who has indicated his/her understanding and acceptance.     Dental Advisory Given  Plan Discussed with: Anesthesiologist,  CRNA and Surgeon  Anesthesia Plan Comments: (Patient consented for risks of anesthesia including but not limited to:  - adverse reactions to medications - risk of airway placement if required - damage to eyes, teeth, lips or other oral mucosa - nerve damage due to positioning  - sore throat or hoarseness - Damage to heart, brain, nerves, lungs, other parts of body or loss of life  Patient voiced understanding.)       Anesthesia Quick Evaluation

## 2022-06-19 NOTE — Anesthesia Postprocedure Evaluation (Signed)
Anesthesia Post Note  Patient: Ernest Dixon  Procedure(s) Performed: VASECTOMY (Bilateral)  Patient location during evaluation: PACU Anesthesia Type: General Level of consciousness: awake and alert Pain management: pain level controlled Vital Signs Assessment: post-procedure vital signs reviewed and stable Respiratory status: spontaneous breathing, nonlabored ventilation, respiratory function stable and patient connected to nasal cannula oxygen Cardiovascular status: blood pressure returned to baseline and stable Postop Assessment: no apparent nausea or vomiting Anesthetic complications: no  No notable events documented.   Last Vitals:  Vitals:   06/19/22 1415 06/19/22 1430  BP: 114/82 119/82  Pulse: (!) 55 (!) 50  Resp: 12 17  Temp:    SpO2: 100% 100%    Last Pain:  Vitals:   06/19/22 1430  TempSrc:   PainSc: 2                  Stephanie Coup

## 2022-06-20 ENCOUNTER — Encounter: Payer: Self-pay | Admitting: Urology

## 2022-08-25 ENCOUNTER — Other Ambulatory Visit: Payer: BC Managed Care – PPO

## 2022-09-18 ENCOUNTER — Other Ambulatory Visit: Payer: BC Managed Care – PPO

## 2022-09-18 DIAGNOSIS — Z302 Encounter for sterilization: Secondary | ICD-10-CM

## 2022-09-19 LAB — POST-VAS SPERM EVALUATION,QUAL: Volume: 3.1 mL

## 2022-09-20 ENCOUNTER — Other Ambulatory Visit: Payer: BC Managed Care – PPO
